# Patient Record
Sex: Female | Born: 1990 | Race: Asian | Hispanic: No | Marital: Married | Smoking: Never smoker
Health system: Southern US, Community
[De-identification: ages and names within clinical notes are randomized; demographics above are authoritative.]

## PROBLEM LIST (undated history)

## (undated) ENCOUNTER — Inpatient Hospital Stay (HOSPITAL_COMMUNITY): Payer: Self-pay

## (undated) DIAGNOSIS — I959 Hypotension, unspecified: Secondary | ICD-10-CM

## (undated) DIAGNOSIS — F329 Major depressive disorder, single episode, unspecified: Secondary | ICD-10-CM

## (undated) DIAGNOSIS — F32A Depression, unspecified: Secondary | ICD-10-CM

## (undated) DIAGNOSIS — R51 Headache: Secondary | ICD-10-CM

## (undated) DIAGNOSIS — R519 Headache, unspecified: Secondary | ICD-10-CM

## (undated) HISTORY — PX: TONSILLECTOMY: SUR1361

## (undated) HISTORY — DX: Depression, unspecified: F32.A

## (undated) HISTORY — DX: Major depressive disorder, single episode, unspecified: F32.9

---

## 2012-07-29 ENCOUNTER — Encounter: Payer: Self-pay | Admitting: Emergency Medicine

## 2012-07-29 ENCOUNTER — Ambulatory Visit: Payer: Self-pay | Admitting: Emergency Medicine

## 2012-07-29 VITALS — BP 95/61 | HR 79 | Temp 97.6°F | Resp 16 | Ht <= 58 in | Wt 80.4 lb

## 2012-07-29 DIAGNOSIS — F502 Bulimia nervosa, unspecified: Secondary | ICD-10-CM

## 2012-07-29 DIAGNOSIS — N926 Irregular menstruation, unspecified: Secondary | ICD-10-CM

## 2012-07-29 DIAGNOSIS — R112 Nausea with vomiting, unspecified: Secondary | ICD-10-CM

## 2012-07-29 DIAGNOSIS — N912 Amenorrhea, unspecified: Secondary | ICD-10-CM

## 2012-07-29 LAB — POCT URINE PREGNANCY: Preg Test, Ur: NEGATIVE

## 2012-07-29 NOTE — Progress Notes (Signed)
[facility] [address] [place] [address] [phone]  Date:  07/29/2012   Name:  Amanda Fischer   DOB:  1990/07/11   MRN:  [id number]  PCP:  No PCP Per Patient    Chief Complaint: Nausea and Emesis   History of Present Illness:  Amanda Fischer is a 22 y.o. very pleasant female patient who presents with the following:  Missed two periods and requests a pregnancy test as she is nauseated in the mornings.  Apparently she eats little and often vomits following meals  whe was treated by a vietnamese doctor in [place] with a medication for anxiety but they do not know the name of the medication.  Denies other complaint or health concern today.   There are no active problems to display for this patient.   No past medical history on file.  No past surgical history on file.  History  Substance Use Topics  . Smoking status: Never Smoker   . Smokeless tobacco: Not on file  . Alcohol Use: No    No family history on file.  No Known Allergies  Medication list has been reviewed and updated.  No current outpatient prescriptions on file prior to visit.   No current facility-administered medications on file prior to visit.    Review of Systems:  As per HPI, otherwise negative.    Physical Examination: Filed Vitals:   07/29/12 1101  BP: 95/61  Pulse: 79  Temp: 97.6 F (36.4 C)  Resp: 16   Filed Vitals:   07/29/12 1101  Height: 4' 9.5" (1.461 m)  Weight: 80 lb 6.4 oz (36.469 kg)   Body mass index is 17.09 kg/(m^2). Ideal Body Weight: Weight in (lb) to have BMI = 25: 117.3   GEN: very thin, NAD, Non-toxic, Alert & Oriented x 3 HEENT: Atraumatic, Normocephalic.  Ears and Nose: No external deformity. EXTR: No clubbing/cyanosis/edema NEURO: Normal gait.  PSYCH: Normally interactive. Conversant. Not depressed or anxious appearing.  Calm demeanor.    Assessment and Plan: Amenorrhea Underweight Bulimia Follow up with native language speaking  physician and therapist Signed,  Phillips Odor, MD

## 2013-02-19 NOTE — L&D Delivery Note (Signed)
Patient was C/C/+3 and pushed for 20 minutes withOUT epidural.   NSVD  female infant, Apgars 9,9, weight p.   The patient had a partial third degree laceration repaired with 2-0 vicryl R. Fundus was firm. EBL was expected. Placenta was delivered intact. Vagina was clear.  Baby was vigorous and doing skin to skin with mother.  Anniece Bleiler A

## 2013-06-15 ENCOUNTER — Inpatient Hospital Stay (HOSPITAL_COMMUNITY): Payer: Medicaid Other

## 2013-06-15 ENCOUNTER — Encounter (HOSPITAL_COMMUNITY): Payer: Self-pay

## 2013-06-15 ENCOUNTER — Inpatient Hospital Stay (HOSPITAL_COMMUNITY)
Admission: AD | Admit: 2013-06-15 | Discharge: 2013-06-15 | Disposition: A | Discharge status: Home / Self Care | Payer: Medicaid Other | Source: Ambulatory Visit | Attending: Obstetrics & Gynecology | Admitting: Obstetrics & Gynecology

## 2013-06-15 DIAGNOSIS — O209 Hemorrhage in early pregnancy, unspecified: Secondary | ICD-10-CM

## 2013-06-15 DIAGNOSIS — I959 Hypotension, unspecified: Secondary | ICD-10-CM | POA: Insufficient documentation

## 2013-06-15 DIAGNOSIS — O26899 Other specified pregnancy related conditions, unspecified trimester: Secondary | ICD-10-CM

## 2013-06-15 DIAGNOSIS — O99891 Other specified diseases and conditions complicating pregnancy: Secondary | ICD-10-CM | POA: Insufficient documentation

## 2013-06-15 DIAGNOSIS — R109 Unspecified abdominal pain: Secondary | ICD-10-CM

## 2013-06-15 DIAGNOSIS — O9989 Other specified diseases and conditions complicating pregnancy, childbirth and the puerperium: Secondary | ICD-10-CM

## 2013-06-15 DIAGNOSIS — O210 Mild hyperemesis gravidarum: Secondary | ICD-10-CM | POA: Insufficient documentation

## 2013-06-15 LAB — CBC
HCT: 33.3 % — ABNORMAL LOW (ref 36.0–46.0)
Hemoglobin: 11.5 g/dL — ABNORMAL LOW (ref 12.0–15.0)
MCH: 27.5 pg (ref 26.0–34.0)
MCHC: 34.5 g/dL (ref 30.0–36.0)
MCV: 79.7 fL (ref 78.0–100.0)
PLATELETS: 257 10*3/uL (ref 150–400)
RBC: 4.18 MIL/uL (ref 3.87–5.11)
RDW: 13.4 % (ref 11.5–15.5)
WBC: 10.2 10*3/uL (ref 4.0–10.5)

## 2013-06-15 LAB — URINALYSIS, ROUTINE W REFLEX MICROSCOPIC
BILIRUBIN URINE: NEGATIVE
GLUCOSE, UA: NEGATIVE mg/dL
Hgb urine dipstick: NEGATIVE
KETONES UR: NEGATIVE mg/dL
Leukocytes, UA: NEGATIVE
Nitrite: NEGATIVE
PH: 6 (ref 5.0–8.0)
Protein, ur: NEGATIVE mg/dL
Specific Gravity, Urine: <1.005 — ABNORMAL LOW (ref 1.005–1.030)
Urobilinogen, UA: 0.2 mg/dL (ref 0.0–1.0)

## 2013-06-15 LAB — WET PREP, GENITAL
CLUE CELLS WET PREP: NONE SEEN
Trich, Wet Prep: NONE SEEN
Yeast Wet Prep HPF POC: NONE SEEN

## 2013-06-15 MED ORDER — DEXTROSE 5 % IN LACTATED RINGERS IV BOLUS: 1000.0000 mL | Freq: Once | INTRAVENOUS | Status: DC

## 2013-06-15 MED ORDER — ONDANSETRON HCL 4 MG/2ML IJ SOLN
4.0000 mg | Freq: Once | INTRAMUSCULAR | Status: AC
  Administered 2013-06-15: 4 mg via INTRAVENOUS
  Filled 2013-06-15: qty 2

## 2013-06-15 MED ORDER — LACTATED RINGERS IV BOLUS (SEPSIS)
1000.0000 mL | Freq: Once | INTRAVENOUS | Status: AC
  Administered 2013-06-15: 1000 mL via INTRAVENOUS

## 2013-06-15 MED ORDER — PROMETHAZINE HCL 25 MG PO TABS: 12.5000 mg | ORAL_TABLET | Freq: Four times a day (QID) | ORAL | Status: DC | PRN

## 2013-06-15 NOTE — MAU Provider Note (Signed)
History     CSN: [id number]  Arrival date and time: 06/15/13 1318   None     Chief Complaint  Patient presents with  . Abdominal Pain  . Emesis   HPI  [name] is a 23 y.o.female G1P0 at 3312w3d who presents with abdominal pain that started yesterday. The pain is located in her RLQ- mid lower quadrant. She denies fever or chills. She is also experiencing nausea and vomiting that she has had throughout her pregnancy. She describes the pain as "numbing pain". She currently rates her pain 5/10.  Pt thinks she ate some food that is not sitting well on her stomach. She [facility]. Pts husband states she vomits everyday and this has been occuring throughout her pregnancy.   OB History   Grav Para Term Preterm Abortions TAB SAB Ect Mult Living   1               History reviewed. No pertinent past medical history.  History reviewed. No pertinent past surgical history.  History reviewed. No pertinent family history.  History  Substance Use Topics  . Smoking status: Never Smoker   . Smokeless tobacco: Not on file  . Alcohol Use: No    Allergies: No Known Allergies  No prescriptions prior to admission   Results for orders placed during the hospital encounter of 06/15/13 (from the past 48 hour(s))  URINALYSIS, ROUTINE W REFLEX MICROSCOPIC     Status: Abnormal   Collection Time    06/15/13  1:43 PM      Result Value Ref Range   Color, Urine YELLOW  YELLOW   APPearance CLEAR  CLEAR   Specific Gravity, Urine <1.005 (*) 1.005 - 1.030   pH 6.0  5.0 - 8.0   Glucose, UA NEGATIVE  NEGATIVE mg/dL   Hgb urine dipstick NEGATIVE  NEGATIVE   Bilirubin Urine NEGATIVE  NEGATIVE   Ketones, ur NEGATIVE  NEGATIVE mg/dL   Protein, ur NEGATIVE  NEGATIVE mg/dL   Urobilinogen, UA 0.2  0.0 - 1.0 mg/dL   Nitrite NEGATIVE  NEGATIVE   Leukocytes, UA NEGATIVE  NEGATIVE   Comment: MICROSCOPIC NOT DONE ON URINES WITH NEGATIVE PROTEIN, BLOOD, LEUKOCYTES,  NITRITE, OR GLUCOSE <1000 mg/dL.  WET PREP, GENITAL     Status: Abnormal   Collection Time    06/15/13  3:45 PM      Result Value Ref Range   Yeast Wet Prep HPF POC NONE SEEN  NONE SEEN   Trich, Wet Prep NONE SEEN  NONE SEEN   Clue Cells Wet Prep HPF POC NONE SEEN  NONE SEEN   WBC, Wet Prep HPF POC MANY (*) NONE SEEN   Comment: MANY BACTERIA SEEN  CBC     Status: Abnormal   Collection Time    06/15/13  3:53 PM      Result Value Ref Range   WBC 10.2  4.0 - 10.5 K/uL   RBC 4.18  3.87 - 5.11 MIL/uL   Hemoglobin 11.5 (*) 12.0 - 15.0 g/dL   HCT 40.933.3 (*) 81.136.0 - 91.446.0 %   MCV 79.7  78.0 - 100.0 fL   MCH 27.5  26.0 - 34.0 pg   MCHC 34.5  30.0 - 36.0 g/dL   RDW 78.213.4  95.611.5 - 21.315.5 %   Platelets 257  150 - 400 K/uL    Review of Systems  Constitutional: Negative for fever, chills, malaise/fatigue and diaphoresis.  Gastrointestinal: Positive  for nausea, vomiting and abdominal pain (Right lower quadrant pain ). Negative for diarrhea and constipation.  Genitourinary: Negative for dysuria, urgency, frequency and hematuria.       No vaginal discharge. No vaginal bleeding. No dysuria.   Neurological: Negative for dizziness and weakness.   Physical Exam   Blood pressure 92/61, pulse 70, temperature 98 F (36.7 C), temperature source Oral, resp. rate 16, height 4\' 9"  (1.448 m), weight 38.284 kg (84 lb 6.4 oz), last menstrual period 03/13/2013, SpO2 99.00%.  Physical Exam  Constitutional: She is oriented to person, place, and time. She appears well-developed and well-nourished. No distress.  HENT:  Head: Normocephalic.  Eyes: Pupils are equal, round, and reactive to light.  Neck: Neck supple.  Cardiovascular: Normal rate and normal heart sounds.   Respiratory: Effort normal and breath sounds normal. No respiratory distress.  GI: Soft. There is tenderness in the right lower quadrant and suprapubic area. There is rigidity and guarding. There is no rebound, no CVA tenderness and no tenderness  at McBurney's point.  Genitourinary:  Speculum exam: Vagina - Small amount of creamy discharge, no odor Cervix - + contact bleeding, no active bleeding.  Bimanual exam: Cervix closed, no CMT Uterus non tender, enlarged  Right adnexal tenderness, no masses bilaterally GC/Chlam, wet prep done Chaperone present for exam.   Musculoskeletal: Normal range of motion.  Neurological: She is alert and oriented to person, place, and time.  Skin: Skin is warm. She is not diaphoretic. There is pallor.  Psychiatric: Her behavior is normal.    MAU Course  Procedures None  MDM + fht 142 via fetal doppler.  Vietnamese interpretor used.   I asked the patient to jump up and down to elicit pain in the abdomen. Pt denies pain in the abdomen during test.  Negative fever, negative chills.  At discharge patient had a low blood pressure reading. Pt given 1 liter of fluid and zofran 4 mg IVP.  Dr. Mora ApplPinn notified of IV fluid and zofran administration; patient stable for discharge home.     Assessment and Plan   A:  1. Abdominal pain in pregnancy   2. Hypotension   3. Vaginal bleeding in pregnant patient at less than [redacted] weeks gestation   4.  Nausea and vomiting in pregnancy   P:  Discharge home in stable condition RX: phenergan  Pelvic rest  Keep your appointment next week. Return to MAU as needed, if symptoms worsen   Amanda HansenJennifer Irene Rasch, NP  06/15/2013, 3:21 PM

## 2013-06-15 NOTE — MAU Provider Note (Signed)
Reviewed case with NP and I agree with above note  Essie HartWalda Leanne Fischer

## 2013-06-15 NOTE — Discharge Instructions (Signed)
Abdominal Pain During Pregnancy °Abdominal pain is common in pregnancy. Most of the time, it does not cause harm. There are many causes of abdominal pain. Some causes are more serious than others. Some of the causes of abdominal pain in pregnancy are easily diagnosed. Occasionally, the diagnosis takes time to understand. Other times, the cause is not determined. Abdominal pain can be a sign that something is very wrong with the pregnancy, or the pain may have nothing to do with the pregnancy at all. For this reason, always tell your health care provider if you have any abdominal discomfort. °HOME CARE INSTRUCTIONS  °Monitor your abdominal pain for any changes. The following actions may help to alleviate any discomfort you are experiencing: °· Do not have sexual intercourse or put anything in your vagina until your symptoms go away completely. °· Get plenty of rest until your pain improves. °· Drink clear fluids if you feel nauseous. Avoid solid food as long as you are uncomfortable or nauseous. °· Only take over-the-counter or prescription medicine as directed by your health care provider. °· Keep all follow-up appointments with your health care provider. °SEEK IMMEDIATE MEDICAL CARE IF: °· You are bleeding, leaking fluid, or passing tissue from the vagina. °· You have increasing pain or cramping. °· You have persistent vomiting. °· You have painful or bloody urination. °· You have a fever. °· You notice a decrease in your baby's movements. °· You have extreme weakness or feel faint. °· You have shortness of breath, with or without abdominal pain. °· You develop a severe headache with abdominal pain. °· You have abnormal vaginal discharge with abdominal pain. °· You have persistent diarrhea. °· You have abdominal pain that continues even after rest, or gets worse. °MAKE SURE YOU:  °· Understand these instructions. °· Will watch your condition. °· Will get help right away if you are not doing well or get  worse. °Document Released: 02/05/2005 Document Revised: 11/26/2012 Document Reviewed: 09/04/2012 °ExitCare® Patient Information ©2014 [employer]. ° °

## 2013-06-15 NOTE — MAU Note (Signed)
Patient states she had a positive pregnancy test in Amanda Fischer that gives her a due date of 10-30 Has been having abdominal pain since yesterday. States she has had vomiting almost every day for a while. Denies bleeding or discharge. Has an appointment next week with Dr. Vincente PoliGrewal.

## 2013-06-16 LAB — GC/CHLAMYDIA PROBE AMP
CT PROBE, AMP APTIMA: NEGATIVE
GC PROBE AMP APTIMA: NEGATIVE

## 2013-06-22 ENCOUNTER — Other Ambulatory Visit: Payer: Self-pay | Admitting: Obstetrics and Gynecology

## 2013-06-22 LAB — OB RESULTS CONSOLE RUBELLA ANTIBODY, IGM: Rubella: IMMUNE

## 2013-06-22 LAB — OB RESULTS CONSOLE RPR: RPR: NONREACTIVE

## 2013-06-22 LAB — OB RESULTS CONSOLE HEPATITIS B SURFACE ANTIGEN: Hepatitis B Surface Ag: NEGATIVE

## 2013-06-22 LAB — OB RESULTS CONSOLE HIV ANTIBODY (ROUTINE TESTING): HIV: NONREACTIVE

## 2013-06-29 LAB — OB RESULTS CONSOLE ABO/RH: RH TYPE: POSITIVE

## 2013-06-29 LAB — OB RESULTS CONSOLE ANTIBODY SCREEN: Antibody Screen: NEGATIVE

## 2013-11-06 LAB — OB RESULTS CONSOLE GC/CHLAMYDIA
CHLAMYDIA, DNA PROBE: NEGATIVE
Gonorrhea: NEGATIVE

## 2013-11-06 LAB — OB RESULTS CONSOLE GBS: STREP GROUP B AG: NEGATIVE

## 2013-12-01 ENCOUNTER — Encounter (HOSPITAL_COMMUNITY): Payer: Self-pay

## 2013-12-01 ENCOUNTER — Inpatient Hospital Stay (HOSPITAL_COMMUNITY)
Admission: AD | Admit: 2013-12-01 | Discharge: 2013-12-04 | DRG: 989 | Disposition: A | Discharge status: Home / Self Care | Payer: BC Managed Care – PPO | Source: Ambulatory Visit | Attending: Obstetrics and Gynecology | Admitting: Obstetrics and Gynecology

## 2013-12-01 DIAGNOSIS — O9989 Other specified diseases and conditions complicating pregnancy, childbirth and the puerperium: Secondary | ICD-10-CM | POA: Diagnosis present

## 2013-12-01 DIAGNOSIS — IMO0001 Reserved for inherently not codable concepts without codable children: Secondary | ICD-10-CM

## 2013-12-01 DIAGNOSIS — Z3A39 39 weeks gestation of pregnancy: Secondary | ICD-10-CM | POA: Diagnosis present

## 2013-12-01 HISTORY — DX: Headache, unspecified: R51.9

## 2013-12-01 HISTORY — DX: Hypotension, unspecified: I95.9

## 2013-12-01 HISTORY — DX: Headache: R51

## 2013-12-01 LAB — ABO/RH: ABO/RH(D): O POS

## 2013-12-01 LAB — CBC
HEMATOCRIT: 36.5 % (ref 36.0–46.0)
HEMOGLOBIN: 12.3 g/dL (ref 12.0–15.0)
MCH: 26.6 pg (ref 26.0–34.0)
MCHC: 33.7 g/dL (ref 30.0–36.0)
MCV: 79 fL (ref 78.0–100.0)
Platelets: 261 10*3/uL (ref 150–400)
RBC: 4.62 MIL/uL (ref 3.87–5.11)
RDW: 15.2 % (ref 11.5–15.5)
WBC: 8.7 10*3/uL (ref 4.0–10.5)

## 2013-12-01 LAB — POCT FERN TEST: POCT FERN TEST: POSITIVE

## 2013-12-01 LAB — TYPE AND SCREEN
ABO/RH(D): O POS
Antibody Screen: NEGATIVE

## 2013-12-01 LAB — RPR

## 2013-12-01 MED ORDER — OXYTOCIN BOLUS FROM INFUSION
500.0000 mL | INTRAVENOUS | Status: DC
  Administered 2013-12-02: 500 mL via INTRAVENOUS

## 2013-12-01 MED ORDER — LACTATED RINGERS IV SOLN
INTRAVENOUS | Status: DC
  Administered 2013-12-01: 13:00:00 via INTRAVENOUS

## 2013-12-01 MED ORDER — LIDOCAINE HCL (PF) 1 % IJ SOLN
30.0000 mL | INTRAMUSCULAR | Status: AC | PRN
  Administered 2013-12-02: 30 mL via SUBCUTANEOUS
  Filled 2013-12-01: qty 30

## 2013-12-01 MED ORDER — FENTANYL 2.5 MCG/ML BUPIVACAINE 1/10 % EPIDURAL INFUSION (WH - ANES): 14.0000 mL/h | INTRAMUSCULAR | Status: DC | PRN

## 2013-12-01 MED ORDER — OXYCODONE-ACETAMINOPHEN 5-325 MG PO TABS
2.0000 | ORAL_TABLET | ORAL | Status: DC | PRN
Start: 2013-12-01 — End: 2013-12-02

## 2013-12-01 MED ORDER — PHENYLEPHRINE 40 MCG/ML (10ML) SYRINGE FOR IV PUSH (FOR BLOOD PRESSURE SUPPORT)
80.0000 ug | PREFILLED_SYRINGE | INTRAVENOUS | Status: DC | PRN
  Filled 2013-12-01: qty 2

## 2013-12-01 MED ORDER — LACTATED RINGERS IV SOLN
500.0000 mL | INTRAVENOUS | Status: DC | PRN
  Administered 2013-12-01: 500 mL via INTRAVENOUS

## 2013-12-01 MED ORDER — OXYTOCIN 40 UNITS IN LACTATED RINGERS INFUSION - SIMPLE MED
1.0000 m[IU]/min | INTRAVENOUS | Status: DC
  Administered 2013-12-01: 2 m[IU]/min via INTRAVENOUS
  Filled 2013-12-01: qty 1000

## 2013-12-01 MED ORDER — DIPHENHYDRAMINE HCL 50 MG/ML IJ SOLN: 12.5000 mg | INTRAMUSCULAR | Status: DC | PRN

## 2013-12-01 MED ORDER — OXYCODONE-ACETAMINOPHEN 5-325 MG PO TABS: 1.0000 | ORAL_TABLET | ORAL | Status: DC | PRN

## 2013-12-01 MED ORDER — FLEET ENEMA 7-19 GM/118ML RE ENEM: 1.0000 | ENEMA | RECTAL | Status: DC | PRN

## 2013-12-01 MED ORDER — EPHEDRINE 5 MG/ML INJ
10.0000 mg | INTRAVENOUS | Status: DC | PRN
  Filled 2013-12-01: qty 2

## 2013-12-01 MED ORDER — ONDANSETRON HCL 4 MG/2ML IJ SOLN: 4.0000 mg | Freq: Four times a day (QID) | INTRAMUSCULAR | Status: DC | PRN

## 2013-12-01 MED ORDER — ACETAMINOPHEN 325 MG PO TABS: 650.0000 mg | ORAL_TABLET | ORAL | Status: DC | PRN

## 2013-12-01 MED ORDER — BUTORPHANOL TARTRATE 1 MG/ML IJ SOLN
1.0000 mg | INTRAMUSCULAR | Status: DC | PRN
  Administered 2013-12-01: 1 mg via INTRAVENOUS
  Filled 2013-12-01: qty 1

## 2013-12-01 MED ORDER — TERBUTALINE SULFATE 1 MG/ML IJ SOLN: 0.2500 mg | Freq: Once | INTRAMUSCULAR | Status: AC | PRN

## 2013-12-01 MED ORDER — OXYTOCIN 40 UNITS IN LACTATED RINGERS INFUSION - SIMPLE MED: 62.5000 mL/h | INTRAVENOUS | Status: DC

## 2013-12-01 MED ORDER — CITRIC ACID-SODIUM CITRATE 334-500 MG/5ML PO SOLN: 30.0000 mL | ORAL | Status: DC | PRN

## 2013-12-01 MED ORDER — LACTATED RINGERS IV SOLN: 500.0000 mL | Freq: Once | INTRAVENOUS | Status: DC

## 2013-12-01 NOTE — Progress Notes (Signed)
[facility] called for admission Santina EvansCatherine #[id number]#[phone].

## 2013-12-01 NOTE — MAU Note (Signed)
Patient states she started leaking clear fluid at 0830. Denies pain or bleeding and reports good fetal movement.

## 2013-12-01 NOTE — H&P (Signed)
23 y.o. 392w1d  G1P0 comes in c/o SROM at 0830, clear fluid..  Otherwise has good fetal movement and no bleeding.  Past Medical History  Diagnosis Date  . Headache   . Hypotension     Past Surgical History  Procedure Laterality Date  . Tonsillectomy      OB History  Gravida Para Term Preterm AB SAB TAB Ectopic Multiple Living  1             # Outcome Date GA Lbr Len/2nd Weight Sex Delivery Anes PTL Lv  1 CUR               History   Social History  . Marital Status: Married    Spouse Name: N/A    Number of Children: N/A  . Years of Education: N/A   Occupational History  . Not on file.   Social History Main Topics  . Smoking status: Never Smoker   . Smokeless tobacco: Not on file  . Alcohol Use: No  . Drug Use: No  . Sexual Activity: Yes    Partners: Male     Comment: married   Other Topics Concern  . Not on file   Social History Narrative  . No narrative on file   Review of patient's allergies indicates no known allergies.    Prenatal Transfer Tool  Maternal Diabetes: No Genetic Screening: Normal Maternal Ultrasounds/Referrals: Normal Fetal Ultrasounds or other Referrals:  None Maternal Substance Abuse:  No Significant Maternal Medications:  None Significant Maternal Lab Results: None  Other PNC: uncomplicated.    Filed Vitals:   12/01/13 1548  BP: 89/55  Pulse: 100  Temp:   Resp: 20     Lungs/Cor:  NAD Abdomen:  soft, gravid Ex:  no cords, erythema SVE:  2.5/70/-2 per nurse. FHTs:  120, good STV, NST R Toco:  q3-4   A/P   Term SROM. Pitocin augmentation if needed.   GBS neg.  Fallon Haecker A

## 2013-12-02 ENCOUNTER — Encounter (HOSPITAL_COMMUNITY): Payer: Self-pay

## 2013-12-02 LAB — CBC
HEMATOCRIT: 32.8 % — AB (ref 36.0–46.0)
Hemoglobin: 11.1 g/dL — ABNORMAL LOW (ref 12.0–15.0)
MCH: 27 pg (ref 26.0–34.0)
MCHC: 33.8 g/dL (ref 30.0–36.0)
MCV: 79.8 fL (ref 78.0–100.0)
Platelets: 264 10*3/uL (ref 150–400)
RBC: 4.11 MIL/uL (ref 3.87–5.11)
RDW: 15.3 % (ref 11.5–15.5)
WBC: 19 10*3/uL — ABNORMAL HIGH (ref 4.0–10.5)

## 2013-12-02 MED ORDER — MEASLES, MUMPS & RUBELLA VAC ~~LOC~~ INJ
0.5000 mL | INJECTION | Freq: Once | SUBCUTANEOUS | Status: DC
  Filled 2013-12-02: qty 0.5

## 2013-12-02 MED ORDER — METHYLERGONOVINE MALEATE 0.2 MG/ML IJ SOLN: 0.2000 mg | INTRAMUSCULAR | Status: DC | PRN

## 2013-12-02 MED ORDER — ZOLPIDEM TARTRATE 5 MG PO TABS: 5.0000 mg | ORAL_TABLET | Freq: Every evening | ORAL | Status: DC | PRN

## 2013-12-02 MED ORDER — BENZOCAINE-MENTHOL 20-0.5 % EX AERO
1.0000 "application " | INHALATION_SPRAY | CUTANEOUS | Status: DC | PRN
  Administered 2013-12-02: 1 via TOPICAL
  Filled 2013-12-02: qty 56

## 2013-12-02 MED ORDER — SODIUM CHLORIDE 0.9 % IV SOLN: 250.0000 mL | INTRAVENOUS | Status: DC | PRN

## 2013-12-02 MED ORDER — METHYLERGONOVINE MALEATE 0.2 MG PO TABS: 0.2000 mg | ORAL_TABLET | ORAL | Status: DC | PRN

## 2013-12-02 MED ORDER — WITCH HAZEL-GLYCERIN EX PADS: 1.0000 "application " | MEDICATED_PAD | CUTANEOUS | Status: DC | PRN

## 2013-12-02 MED ORDER — SODIUM CHLORIDE 0.9 % IJ SOLN: 3.0000 mL | Freq: Two times a day (BID) | INTRAMUSCULAR | Status: DC

## 2013-12-02 MED ORDER — SENNOSIDES-DOCUSATE SODIUM 8.6-50 MG PO TABS
2.0000 | ORAL_TABLET | ORAL | Status: DC
  Administered 2013-12-02: 2 via ORAL
  Filled 2013-12-02: qty 2

## 2013-12-02 MED ORDER — FERROUS SULFATE 325 (65 FE) MG PO TABS
325.0000 mg | ORAL_TABLET | Freq: Two times a day (BID) | ORAL | Status: DC
  Administered 2013-12-02 – 2013-12-04 (×5): 325 mg via ORAL
  Filled 2013-12-02 (×5): qty 1

## 2013-12-02 MED ORDER — DIBUCAINE 1 % RE OINT: 1.0000 "application " | TOPICAL_OINTMENT | RECTAL | Status: DC | PRN

## 2013-12-02 MED ORDER — IBUPROFEN 800 MG PO TABS
800.0000 mg | ORAL_TABLET | Freq: Three times a day (TID) | ORAL | Status: DC
Start: 2013-12-02 — End: 2013-12-04
  Administered 2013-12-02 – 2013-12-04 (×7): 800 mg via ORAL
  Filled 2013-12-02 (×7): qty 1

## 2013-12-02 MED ORDER — ONDANSETRON HCL 4 MG/2ML IJ SOLN: 4.0000 mg | INTRAMUSCULAR | Status: DC | PRN

## 2013-12-02 MED ORDER — INFLUENZA VAC SPLIT QUAD 0.5 ML IM SUSY
0.5000 mL | PREFILLED_SYRINGE | INTRAMUSCULAR | Status: AC
  Administered 2013-12-03: 0.5 mL via INTRAMUSCULAR
  Filled 2013-12-02: qty 0.5

## 2013-12-02 MED ORDER — PRENATAL MULTIVITAMIN CH
1.0000 | ORAL_TABLET | Freq: Every day | ORAL | Status: DC
  Administered 2013-12-02 – 2013-12-04 (×3): 1 via ORAL
  Filled 2013-12-02 (×3): qty 1

## 2013-12-02 MED ORDER — OXYCODONE-ACETAMINOPHEN 5-325 MG PO TABS: 2.0000 | ORAL_TABLET | ORAL | Status: DC | PRN

## 2013-12-02 MED ORDER — SODIUM CHLORIDE 0.9 % IJ SOLN: 3.0000 mL | INTRAMUSCULAR | Status: DC | PRN

## 2013-12-02 MED ORDER — TETANUS-DIPHTH-ACELL PERTUSSIS 5-2.5-18.5 LF-MCG/0.5 IM SUSP
0.5000 mL | Freq: Once | INTRAMUSCULAR | Status: AC
Start: 2013-12-03 — End: 2013-12-02
  Administered 2013-12-02: 0.5 mL via INTRAMUSCULAR

## 2013-12-02 MED ORDER — LANOLIN HYDROUS EX OINT: TOPICAL_OINTMENT | CUTANEOUS | Status: DC | PRN

## 2013-12-02 MED ORDER — SIMETHICONE 80 MG PO CHEW: 80.0000 mg | CHEWABLE_TABLET | ORAL | Status: DC | PRN

## 2013-12-02 MED ORDER — OXYCODONE-ACETAMINOPHEN 5-325 MG PO TABS: 1.0000 | ORAL_TABLET | ORAL | Status: DC | PRN

## 2013-12-02 MED ORDER — ONDANSETRON HCL 4 MG PO TABS: 4.0000 mg | ORAL_TABLET | ORAL | Status: DC | PRN

## 2013-12-02 MED ORDER — MAGNESIUM HYDROXIDE 400 MG/5ML PO SUSP: 30.0000 mL | ORAL | Status: DC | PRN

## 2013-12-02 MED ORDER — DIPHENHYDRAMINE HCL 25 MG PO CAPS: 25.0000 mg | ORAL_CAPSULE | Freq: Four times a day (QID) | ORAL | Status: DC | PRN

## 2013-12-02 NOTE — Lactation Note (Signed)
This note was copied from the chart of Amanda Fischer. Lactation Consultation Note  Patient Name: Amanda Fischer Today's Date: 12/02/2013 Reason for consult: Initial assessment of this primipara and her newborn at 3121 hours of age.  Mom is primipara and speaks only [place]namese but FOB is at bedside and speaks and understands English and is able to translate for his wife.  Mom's choice on admission was to both breastfeed and formula feed and mom has been giving formula, stating that she "has no milk" per FOB.  LC reviewed small newborn stomach size and benefits of breast milk, as well as need for frequent STS and cue feedings and avoiding supplement while establishing milk supply.  FOB verbalizes understanding this recommendation but insists that baby still needs some formula. Baby has been having LATCH scores of 9 per RN assessment but has also received 10-20 ml's of formula at three feedings this evening.  Mom encouraged to feed baby 8-12 times/24 hours and with feeding cues. LC encouraged review of Baby and Me pp 9, 14 and 20-25 for STS and BF information. LC provided [facility] Resource brochure and reviewed [facility] services and list of community and web site resources.    Maternal Data Formula Feeding for Exclusion: Yes Reason for exclusion: Mother's choice to formula and breast feed on admission Has patient been taught Hand Expression?: Yes (documented on education of mother, per RN) Does the patient have breastfeeding experience prior to this delivery?: No  Feeding    LATCH Score/Interventions           LATCH scores=9 at two feedings per RN assessment           Lactation Tools Discussed/Used   STS, cue feedings, hand expression and reasons for mom to have small amounts of colostrum in her soft breasts while baby is learning to breastfeed, reasons to avoid formula while establishing breastfeeding and milk supply  Consult Status Consult Status: Follow-up Date: 12/03/13 Follow-up type:  In-patient    Warrick ParisianBryant, Yentl Verge [facility] 12/02/2013, 10:24 PM

## 2013-12-02 NOTE — Progress Notes (Signed)
Patient and baby care education reviewed with patient using [employer] [phone]#[id number]. Patient verbalized her understanding. FOB at bedside. Boykin PeekNancy Yuliza Cara, RN

## 2013-12-03 NOTE — Lactation Note (Signed)
This note was copied from the chart of Girl Kamyah Dake. Lactation Consultation Note  Patient Name: Girl Sharhonda Mederos ZOXWR'UToday's Date: 12/03/2013 Reason Meyer Russelfor consult: Follow-up assessment Family member present to translate. Mom [facility]. Baby demonstrating a good rhythmic suck with few swallows noted. Mom reports some mild nipple tenderness, no breakdown noted. Care for sore nipples reviewed with Mom, advised apply EBM. Mom is breast/formula feeding. Reviewed importance of BF with each feeding before offering any bottles. Reviewed supplemental guidelines per hours of age. Mom denies other questions/concerns. Encouraged to ask for assist as needed.   Maternal Data    Feeding Feeding Type: Breast Fed Nipple Type: Slow - flow Length of feed: 20 min  LATCH Score/Interventions Latch: Grasps breast easily, tongue down, lips flanged, rhythmical sucking.  Audible Swallowing: A few with stimulation Intervention(s): Skin to skin;Hand expression  Type of Nipple: Everted at rest and after stimulation  Comfort (Breast/Nipple): Filling, red/small blisters or bruises, mild/mod discomfort  Problem noted: Mild/Moderate discomfort (No breakdown noted, advised apply EBM) Interventions (Mild/moderate discomfort): Hand expression  Hold (Positioning): No assistance needed to correctly position infant at breast.  LATCH Score: 8  Lactation Tools Discussed/Used     Consult Status Consult Status: Follow-up Date: 12/04/13 Follow-up type: In-patient    Alfred LevinsGranger, Seanmichael Salmons Ann 12/03/2013, 3:11 PM

## 2013-12-03 NOTE — Progress Notes (Signed)
Post Partum Day 1 Subjective: no complaints, up ad lib, voiding, tolerating PO and breast feeding  Objective: Blood pressure 81/50, pulse 81, temperature 97.8 F (36.6 C), temperature source Oral, resp. rate 17, height 4' 9.25" (1.454 m), weight 53.434 kg (117 lb 12.8 oz), last menstrual period 03/13/2013, SpO2 99.00%, unknown if currently breastfeeding.  Physical Exam:  General: alert, cooperative and no distress Lochia: appropriate Uterine Fundus: firm DVT Evaluation: No evidence of DVT seen on physical exam. Negative Homan's sign. No cords or calf tenderness.   Recent Labs  12/01/13 1232 12/02/13 0655  HGB 12.3 11.1*  HCT 36.5 32.8*    Assessment/Plan: Plan for discharge tomorrow and Breastfeeding   LOS: 2 days   Theordore Cisnero STACIA 12/03/2013, 8:58 AM

## 2013-12-04 NOTE — Discharge Summary (Signed)
Obstetric Discharge Summary Reason for Admission: rupture of membranes Prenatal Procedures: ultrasound Intrapartum Procedures: spontaneous vaginal delivery Postpartum Procedures: none Complications-Operative and Postpartum: 3rd degree perineal laceration Hemoglobin  Date Value Ref Range Status  12/02/2013 11.1* 12.0 - 15.0 g/dL Final     HCT  Date Value Ref Range Status  12/02/2013 32.8* 36.0 - 46.0 % Final    Physical Exam:  General: alert and cooperative Lochia: appropriate Uterine Fundus: firm DVT Evaluation: No evidence of DVT seen on physical exam.  Discharge Diagnoses: Term Pregnancy-delivered  Discharge Information: Date: 12/04/2013 Activity: pelvic rest Diet: routine Medications: PNV and Ibuprofen Condition: stable Instructions: refer to practice specific booklet Discharge to: home Follow-up Information   Follow up with HORVATH,MICHELLE A, MD In 4 weeks.   Specialty:  Obstetrics and Gynecology   Contact information:   [address] [place] [id number] [phone]       Newborn Data: Live born female  Birth Weight: 6 lb 10.5 oz (3020 g) APGAR: 9, 9  Home with mother.  Amanda Fischer 12/04/2013, 10:05 AM

## 2013-12-04 NOTE — Lactation Note (Signed)
This note was copied from the chart of Amanda Marisal Brouillet. Lactation Consultation Note  Patient Name: Amanda Fischer [id number]'s Date: 12/04/2013 Reason for consult: Follow-up assessment  Mom c/o pinching pain with breastfeeding upon entering room.  Infant at breast with a shallow latch using cradle hold.  LC assisted with depth and flanging of lips.  Dad interpreted for mom. Infant has breastfed x7 (10-20 min) + bottles formula x5 (12-25 ml) in past 24 hrs; voids - 3 in 24 hrs/ 5 life; stools-3 in 24 hrs/ 4 life.  Taught mom cross-cradle hold and how to achieve depth with flanging lips.  After assisting with depth, mom stated the feeding felt "normal."  Mom's breasts are filling; educated on milk production, supply/demand, importance of exclusive breastfeeding r/t milk supply, and engorgement prevention.  Mom has hand pump for discharge.  Used Baby & Me booklet for teaching about milk storage and pictures of feeding positions.  Mom has WIC.  Informed of support group and outpatient services.  Encouraged to call for questions after discharge as needed.     Maternal Data    Feeding Feeding Type: Breast Fed Length of feed: 20 min  LATCH Score/Interventions Latch: Grasps breast easily, tongue down, lips flanged, rhythmical sucking.  Audible Swallowing: A few with stimulation  Type of Nipple: Everted at rest and after stimulation  Comfort (Breast/Nipple): Soft / non-tender     Hold (Positioning): Assistance needed to correctly position infant at breast and maintain latch. Intervention(s): Breastfeeding basics reviewed;Support Pillows;Position options;Skin to skin  LATCH Score: 8  Lactation Tools Discussed/Used WIC Program: Yes   Consult Status Consult Status: Complete    Lendon KaVann, October Peery Walker 12/04/2013, 11:03 AM

## 2013-12-04 NOTE — Progress Notes (Signed)
Patient is eating, ambulating, voiding.  Pain control is good.  Appropriate lochia.  No complaints.  Filed Vitals:   12/03/13 0700 12/03/13 0830 12/03/13 1818 12/04/13 0610  BP: 81/50 85/56 84/53  84/56  Pulse: 81 85 95 79  Temp:   97.9 F (36.6 C) 97.8 F (36.6 C)  TempSrc:   Oral Oral  Resp:   18 18  Height:      Weight:      SpO2:    100%    Fundus firm Perineum without swelling. No CT  Lab Results  Component Value Date   WBC 19.0* 12/02/2013   HGB 11.1* 12/02/2013   HCT 32.8* 12/02/2013   MCV 79.8 12/02/2013   PLT 264 12/02/2013    --/--/O POS, O POS (10/13 1415)  A/P Post partum day 2.  Routine care.  D/C today.    Philip AspenALLAHAN, Sui Kasparek

## 2013-12-07 NOTE — Progress Notes (Signed)
Post discharge chart review completed.  

## 2013-12-21 ENCOUNTER — Encounter (HOSPITAL_COMMUNITY): Payer: Self-pay

## 2015-02-16 ENCOUNTER — Ambulatory Visit (INDEPENDENT_AMBULATORY_CARE_PROVIDER_SITE_OTHER): Payer: 59

## 2015-02-16 ENCOUNTER — Ambulatory Visit (INDEPENDENT_AMBULATORY_CARE_PROVIDER_SITE_OTHER): Payer: 59 | Admitting: Family Medicine

## 2015-02-16 VITALS — BP 80/60 | HR 78 | Temp 97.9°F | Resp 16 | Ht <= 58 in | Wt 91.0 lb

## 2015-02-16 DIAGNOSIS — R1033 Periumbilical pain: Secondary | ICD-10-CM

## 2015-02-16 DIAGNOSIS — R51 Headache: Secondary | ICD-10-CM | POA: Diagnosis not present

## 2015-02-16 DIAGNOSIS — R1011 Right upper quadrant pain: Secondary | ICD-10-CM

## 2015-02-16 DIAGNOSIS — R110 Nausea: Secondary | ICD-10-CM | POA: Diagnosis not present

## 2015-02-16 DIAGNOSIS — R519 Headache, unspecified: Secondary | ICD-10-CM

## 2015-02-16 LAB — POCT CBC
Granulocyte percent: 53 %G (ref 37–80)
HEMATOCRIT: 39.3 % (ref 37.7–47.9)
Hemoglobin: 13.3 g/dL (ref 12.2–16.2)
LYMPH, POC: 1.7 (ref 0.6–3.4)
MCH, POC: 27 pg (ref 27–31.2)
MCHC: 33.8 g/dL (ref 31.8–35.4)
MCV: 80 fL (ref 80–97)
MID (cbc): 0.6 (ref 0–0.9)
MPV: 8.2 fL (ref 0–99.8)
PLATELET COUNT, POC: 252 10*3/uL (ref 142–424)
POC Granulocyte: 2.6 (ref 2–6.9)
POC LYMPH %: 33.9 % (ref 10–50)
POC MID %: 13.1 %M — AB (ref 0–12)
RBC: 4.91 M/uL (ref 4.04–5.48)
RDW, POC: 13.2 %
WBC: 4.9 10*3/uL (ref 4.6–10.2)

## 2015-02-16 LAB — COMPLETE METABOLIC PANEL WITH GFR
ALBUMIN: 4.5 g/dL (ref 3.6–5.1)
ALK PHOS: 54 U/L (ref 33–115)
ALT: 15 U/L (ref 6–29)
AST: 19 U/L (ref 10–30)
BUN: 10 mg/dL (ref 7–25)
CO2: 24 mmol/L (ref 20–31)
Calcium: 9.2 mg/dL (ref 8.6–10.2)
Chloride: 106 mmol/L (ref 98–110)
Creat: 0.64 mg/dL (ref 0.50–1.10)
GFR, Est African American: >89 mL/min (ref >=60)
GFR, Est Non African American: >89 mL/min (ref >=60)
Glucose, Bld: 75 mg/dL (ref 65–99)
POTASSIUM: 4.5 mmol/L (ref 3.5–5.3)
SODIUM: 141 mmol/L (ref 135–146)
TOTAL PROTEIN: 7.5 g/dL (ref 6.1–8.1)
Total Bilirubin: 0.5 mg/dL (ref 0.2–1.2)

## 2015-02-16 LAB — POCT URINALYSIS DIP (MANUAL ENTRY)
BILIRUBIN UA: NEGATIVE
Bilirubin, UA: NEGATIVE
Blood, UA: NEGATIVE
Glucose, UA: NEGATIVE
NITRITE UA: NEGATIVE
PH UA: 6
PROTEIN UA: NEGATIVE
Spec Grav, UA: 1.01
Urobilinogen, UA: 0.2

## 2015-02-16 LAB — POC MICROSCOPIC URINALYSIS (UMFC): Mucus: ABSENT

## 2015-02-16 LAB — POCT URINE PREGNANCY: Preg Test, Ur: NEGATIVE

## 2015-02-16 MED ORDER — ONDANSETRON 4 MG PO TBDP: ORAL_TABLET | ORAL | Status: DC

## 2015-02-16 NOTE — Progress Notes (Signed)
Patient ID: Amanda Fischer, female    DOB: 1990-12-11  Age: 24 y.o. MRN: [id number]  Chief Complaint  Patient presents with  . Abdominal Pain    Onset 2 days  . Headache  . Anorexia    Onset 2 days    Subjective:   24 year old [place] American lady who has a 2 day history of abdominal pain. It is hurt her in the mid abdomen and right upper quadrant areas. She's been nauseated but not vomiting. She has not felt febrile. She does have a headache. Her last menstrual cycle was the first of the month, does not think she is pregnant. She is gravida 2 para 2. She has not had this problem in the past. She has not had any vomiting or diarrhea. Her bowels been hard.  Current allergies, medications, problem list, past/family and social histories reviewed.  Objective:  BP 80/60 mmHg  Pulse 78  Temp(Src) 97.9 F (36.6 C) (Oral)  Resp 16  Ht 4\' 9"  (1.448 m)  Wt 91 lb (41.277 kg)  BMI 19.69 kg/m2  SpO2 98%  LMP 01/20/2015  No major acute distress. She has not had an appetite. Her neck is supple without nodes. Chest clear. Heart regular without murmurs. Abdomen has active bowel sounds, soft without organomegaly or masses. Has diffuse tenderness, primarily in the mid abdomen and right upper quadrant. She feels a little firm in the sigmoid region. Rectal not done at this time.  Assessment & Plan:   Assessment: 1. Periumbilical abdominal pain   2. Right upper quadrant pain   3. Nausea without vomiting   4. Nonintractable headache, unspecified chronicity pattern, unspecified headache type       Plan: We'll get a pregnancy test, urinalysis, CBC, CMP, and x-ray of the abdomen   [facility] reading (PRIMARY) by  Dr. Alwyn RenHopper Nonspecific gas pattern.  Results for orders placed or performed in visit on 02/16/15  POCT Microscopic Urinalysis ([facility])  Result Value Ref Range   WBC,UR,HPF,POC None None WBC/hpf   RBC,UR,HPF,POC None None RBC/hpf   Bacteria Few (A) None, Too numerous to count   Mucus  Absent Absent   Epithelial Cells, UR Per Microscopy Few (A) None, Too numerous to count cells/hpf  POCT urinalysis dipstick  Result Value Ref Range   Color, UA yellow yellow   Clarity, UA clear clear   Glucose, UA negative negative   Bilirubin, UA negative negative   Ketones, POC UA negative negative   Spec Grav, UA 1.010    Blood, UA negative negative   pH, UA 6.0    Protein Ur, POC negative negative   Urobilinogen, UA 0.2    Nitrite, UA Negative Negative   Leukocytes, UA Trace (A) Negative  POCT urine pregnancy  Result Value Ref Range   Preg Test, Ur Negative Negative  POCT CBC  Result Value Ref Range   WBC 4.9 4.6 - 10.2 K/uL   Lymph, poc 1.7 0.6 - 3.4   POC LYMPH PERCENT 33.9 10 - 50 %L   MID (cbc) 0.6 0 - 0.9   POC MID % 13.1 (A) 0 - 12 %M   POC Granulocyte 2.6 2 - 6.9   Granulocyte percent 53.0 37 - 80 %G   RBC 4.91 4.04 - 5.48 M/uL   Hemoglobin 13.3 12.2 - 16.2 g/dL   HCT, POC 25.939.3 56.337.7 - 47.9 %   MCV 80.0 80 - 97 fL   MCH, POC 27.0 27 - 31.2 pg   MCHC 33.8 31.8 - 35.4 g/dL  RDW, POC 13.2 %   Platelet Count, POC 252 142 - 424 K/uL   MPV 8.2 0 - 99.8 fL   We'll try to have her take some MiraLAX and see if she will get feeling better. Return if worse..        Patient Instructions  Drink plenty of fluids  Take ondansetron (Zofran) one every 6 hours if needed for nausea or vomiting  Take some MiraLAX 1 dose daily to try and clean out the bowels. I think this will help relieve the pain. She has a lot of gas trapped in her intestine.  Return or go to the emergency room if worse at any time, especially if more pain, vomiting, passing of blood, fevers, or other things of concern.     Return if symptoms worsen or fail to improve.   HOPPER,DAVID, MD 02/16/2015

## 2015-02-16 NOTE — Patient Instructions (Signed)
Drink plenty of fluids  Take ondansetron (Zofran) one every 6 hours if needed for nausea or vomiting  Take some MiraLAX 1 dose daily to try and clean out the bowels. I think this will help relieve the pain. She has a lot of gas trapped in her intestine.  Return or go to the emergency room if worse at any time, especially if more pain, vomiting, passing of blood, fevers, or other things of concern.

## 2015-02-20 ENCOUNTER — Encounter: Payer: Self-pay | Admitting: *Deleted

## 2015-09-06 ENCOUNTER — Ambulatory Visit (INDEPENDENT_AMBULATORY_CARE_PROVIDER_SITE_OTHER): Payer: Self-pay | Admitting: Physician Assistant

## 2015-09-06 VITALS — BP 108/68 | HR 83 | Temp 98.1°F | Resp 17 | Ht <= 58 in | Wt 88.0 lb

## 2015-09-06 DIAGNOSIS — N912 Amenorrhea, unspecified: Secondary | ICD-10-CM

## 2015-09-06 LAB — POCT URINE PREGNANCY: Preg Test, Ur: POSITIVE — AB

## 2015-09-06 NOTE — Progress Notes (Signed)
   09/06/2015 3:08 PM   DOB: Feb 27, 1990 / MRN: [id number]  SUBJECTIVE:  Amanda Fischer is a 25 y.o. female presenting for a pregnancy test.  LMP was 08/01/15.  She has been having morning sickness with emesis for the last month.  She is not taking prenatals vitamins. She denies any pain at this time.  She denies any bloody vaginal discharge. She and her partner have been trying to have a baby and have one at home already.   She has No Known Allergies.   She  has a past medical history of Headache and Hypotension.    She  reports that she has never smoked. She does not have any smokeless tobacco history on file. She reports that she does not drink alcohol or use illicit drugs. She  reports that she currently engages in sexual activity and has had female partners. The patient  has past surgical history that includes Tonsillectomy.  Her family history is not on file.  Review of Systems  Constitutional: Negative for fever and chills.  Gastrointestinal: Negative for nausea, vomiting and abdominal pain.    Problem list and medications reviewed and updated by myself where necessary, and exist elsewhere in the encounter.   OBJECTIVE:  BP 108/68 mmHg  Pulse 83  Temp(Src) 98.1 F (36.7 C) (Oral)  Resp 17  Ht 4\' 9"  (1.448 m)  Wt 88 lb (39.917 kg)  BMI 19.04 kg/m2  SpO2 100%  LMP 08/01/2015  Breastfeeding? No  Physical Exam  Cardiovascular: Normal rate.   Pulmonary/Chest: Effort normal.  Abdominal: She exhibits no distension.    Results for orders placed or performed in visit on 09/06/15 (from the past 72 hour(s))  POCT urine pregnancy     Status: Abnormal   Collection Time: 09/06/15  3:03 PM  Result Value Ref Range   Preg Test, Ur Positive (A) Negative    No results found.  Wt Readings from Last 3 Encounters:  09/06/15 88 lb (39.917 kg)  02/16/15 91 lb (41.277 kg)  12/01/13 117 lb 12.8 oz (53.434 kg)     ASSESSMENT AND PLAN  Amanda Fischer was seen today for pregnancy test.  Diagnoses  and all orders for this visit:  Amenorrhea: 2/2 pregnancy.  Due date provided based on LMP.  Advised [facility].  Prenatals.  Avoid any medication but tylenol.  -     POCT urine pregnancy    The patient was advised to call or return to clinic if she does not see an improvement in symptoms, or to seek the care of the closest emergency department if she worsens with the above plan.   Deliah BostonMichael Clark, MHS, PA-C [facility] 09/06/2015 3:08 PM

## 2015-09-06 NOTE — Patient Instructions (Addendum)
You due date is March 19th 2018.  Please start taking one prenatal vitamin daily. [facility] the [facility] and establish an obstetric's appointment.     IF you received an x-ray today, you will receive an invoice from [facility]. Please contact [facility] at [phone] with questions or concerns regarding your invoice.   IF you received labwork today, you will receive an invoice from [facility]. Please contact [facility] at [phone] with questions or concerns regarding your invoice.   Our billing staff will not be able to assist you with questions regarding bills from these companies.  You will be contacted with the lab results as soon as they are available. The fastest way to get your results is to activate your My Chart account. Instructions are located on the last page of this paperwork. If you have not heard from us regarding the results in 2 weeks, please contact this office.

## 2015-11-11 LAB — OB RESULTS CONSOLE HEPATITIS B SURFACE ANTIGEN: Hepatitis B Surface Ag: NEGATIVE

## 2015-11-11 LAB — OB RESULTS CONSOLE GC/CHLAMYDIA
CHLAMYDIA, DNA PROBE: NEGATIVE
GC PROBE AMP, GENITAL: NEGATIVE

## 2015-11-11 LAB — OB RESULTS CONSOLE RPR: RPR: NONREACTIVE

## 2015-11-11 LAB — OB RESULTS CONSOLE ANTIBODY SCREEN: ANTIBODY SCREEN: NEGATIVE

## 2015-11-11 LAB — OB RESULTS CONSOLE HIV ANTIBODY (ROUTINE TESTING): HIV: NONREACTIVE

## 2015-11-11 LAB — OB RESULTS CONSOLE ABO/RH: RH Type: POSITIVE

## 2015-11-11 LAB — OB RESULTS CONSOLE RUBELLA ANTIBODY, IGM: RUBELLA: IMMUNE

## 2016-02-20 NOTE — L&D Delivery Note (Signed)
Pt was admitted in labor. She had an amniotomy with light mec. She progressed along a nl labor curve. She pushed for 15 min. She had a SVD on one live infant over a second degree midline tear in the ROA position. Nuchal cord x 2. Placenta -S/I EBL-400cc. Tear closed with 3-0 chromic. Baby to NBN.

## 2016-03-28 LAB — OB RESULTS CONSOLE GBS: STREP GROUP B AG: NEGATIVE

## 2016-04-20 ENCOUNTER — Encounter (HOSPITAL_COMMUNITY): Payer: Self-pay | Admitting: *Deleted

## 2016-04-20 ENCOUNTER — Telehealth (HOSPITAL_COMMUNITY): Payer: Self-pay | Admitting: *Deleted

## 2016-04-20 NOTE — Telephone Encounter (Signed)
Preadmission screen  

## 2016-04-23 ENCOUNTER — Encounter (HOSPITAL_COMMUNITY): Payer: Self-pay

## 2016-04-23 ENCOUNTER — Inpatient Hospital Stay (HOSPITAL_COMMUNITY): Admission: RE | Admit: 2016-04-23 | Payer: Medicaid Other | Source: Ambulatory Visit

## 2016-04-23 ENCOUNTER — Inpatient Hospital Stay (HOSPITAL_COMMUNITY): Payer: Medicaid Other | Admitting: Anesthesiology

## 2016-04-23 ENCOUNTER — Inpatient Hospital Stay (HOSPITAL_COMMUNITY)
Admission: AD | Admit: 2016-04-23 | Discharge: 2016-04-25 | DRG: 775 | Disposition: A | Discharge status: Home / Self Care | Payer: Medicaid Other | Source: Ambulatory Visit | Attending: Obstetrics and Gynecology | Admitting: Obstetrics and Gynecology

## 2016-04-23 DIAGNOSIS — Z3490 Encounter for supervision of normal pregnancy, unspecified, unspecified trimester: Secondary | ICD-10-CM

## 2016-04-23 DIAGNOSIS — Z3493 Encounter for supervision of normal pregnancy, unspecified, third trimester: Secondary | ICD-10-CM | POA: Diagnosis present

## 2016-04-23 DIAGNOSIS — Z3A39 39 weeks gestation of pregnancy: Secondary | ICD-10-CM

## 2016-04-23 LAB — CBC
HEMATOCRIT: 34.5 % — AB (ref 36.0–46.0)
Hemoglobin: 11.5 g/dL — ABNORMAL LOW (ref 12.0–15.0)
MCH: 24.8 pg — ABNORMAL LOW (ref 26.0–34.0)
MCHC: 33.3 g/dL (ref 30.0–36.0)
MCV: 74.4 fL — ABNORMAL LOW (ref 78.0–100.0)
PLATELETS: 311 10*3/uL (ref 150–400)
RBC: 4.64 MIL/uL (ref 3.87–5.11)
RDW: 16.1 % — AB (ref 11.5–15.5)
WBC: 10.7 10*3/uL — ABNORMAL HIGH (ref 4.0–10.5)

## 2016-04-23 LAB — TYPE AND SCREEN
ABO/RH(D): O POS
Antibody Screen: NEGATIVE

## 2016-04-23 MED ORDER — OXYCODONE-ACETAMINOPHEN 5-325 MG PO TABS: 2.0000 | ORAL_TABLET | ORAL | Status: DC | PRN

## 2016-04-23 MED ORDER — TETANUS-DIPHTH-ACELL PERTUSSIS 5-2.5-18.5 LF-MCG/0.5 IM SUSP: 0.5000 mL | Freq: Once | INTRAMUSCULAR | Status: DC

## 2016-04-23 MED ORDER — OXYTOCIN 40 UNITS IN LACTATED RINGERS INFUSION - SIMPLE MED
2.5000 [IU]/h | INTRAVENOUS | Status: DC
  Administered 2016-04-23: 2.5 [IU]/h via INTRAVENOUS

## 2016-04-23 MED ORDER — OXYCODONE-ACETAMINOPHEN 5-325 MG PO TABS: 1.0000 | ORAL_TABLET | ORAL | Status: DC | PRN

## 2016-04-23 MED ORDER — LACTATED RINGERS IV SOLN: 500.0000 mL | Freq: Once | INTRAVENOUS | Status: DC

## 2016-04-23 MED ORDER — DIBUCAINE 1 % RE OINT
1.0000 | TOPICAL_OINTMENT | RECTAL | Status: DC | PRN
Start: 2016-04-23 — End: 2016-04-25

## 2016-04-23 MED ORDER — PHENYLEPHRINE 40 MCG/ML (10ML) SYRINGE FOR IV PUSH (FOR BLOOD PRESSURE SUPPORT)
80.0000 ug | PREFILLED_SYRINGE | INTRAVENOUS | Status: DC | PRN
  Filled 2016-04-23: qty 10
  Filled 2016-04-23: qty 5

## 2016-04-23 MED ORDER — TERBUTALINE SULFATE 1 MG/ML IJ SOLN
0.2500 mg | Freq: Once | INTRAMUSCULAR | Status: DC | PRN
  Filled 2016-04-23: qty 1

## 2016-04-23 MED ORDER — ACETAMINOPHEN 325 MG PO TABS: 650.0000 mg | ORAL_TABLET | ORAL | Status: DC | PRN

## 2016-04-23 MED ORDER — PHENYLEPHRINE 40 MCG/ML (10ML) SYRINGE FOR IV PUSH (FOR BLOOD PRESSURE SUPPORT)
80.0000 ug | PREFILLED_SYRINGE | INTRAVENOUS | Status: DC | PRN
  Filled 2016-04-23: qty 5

## 2016-04-23 MED ORDER — PHENYLEPHRINE 40 MCG/ML (10ML) SYRINGE FOR IV PUSH (FOR BLOOD PRESSURE SUPPORT): 80.0000 ug | PREFILLED_SYRINGE | INTRAVENOUS | Status: DC | PRN

## 2016-04-23 MED ORDER — ONDANSETRON HCL 4 MG/2ML IJ SOLN: 4.0000 mg | INTRAMUSCULAR | Status: DC | PRN

## 2016-04-23 MED ORDER — OXYTOCIN BOLUS FROM INFUSION
500.0000 mL | Freq: Once | INTRAVENOUS | Status: AC
  Administered 2016-04-23: 500 mL via INTRAVENOUS

## 2016-04-23 MED ORDER — LIDOCAINE HCL (PF) 1 % IJ SOLN
30.0000 mL | INTRAMUSCULAR | Status: DC | PRN
  Filled 2016-04-23: qty 30

## 2016-04-23 MED ORDER — LIDOCAINE HCL (PF) 1 % IJ SOLN
INTRAMUSCULAR | Status: DC | PRN
  Administered 2016-04-23: 3 mL via EPIDURAL
  Administered 2016-04-23: 4 mL via EPIDURAL

## 2016-04-23 MED ORDER — IBUPROFEN 600 MG PO TABS
600.0000 mg | ORAL_TABLET | Freq: Four times a day (QID) | ORAL | Status: DC
  Administered 2016-04-23 – 2016-04-25 (×8): 600 mg via ORAL
  Filled 2016-04-23 (×8): qty 1

## 2016-04-23 MED ORDER — EPHEDRINE 5 MG/ML INJ
10.0000 mg | INTRAVENOUS | Status: DC | PRN
  Filled 2016-04-23: qty 4

## 2016-04-23 MED ORDER — DIPHENHYDRAMINE HCL 50 MG/ML IJ SOLN: 12.5000 mg | INTRAMUSCULAR | Status: DC | PRN

## 2016-04-23 MED ORDER — FLEET ENEMA 7-19 GM/118ML RE ENEM: 1.0000 | ENEMA | RECTAL | Status: DC | PRN

## 2016-04-23 MED ORDER — LACTATED RINGERS IV SOLN
INTRAVENOUS | Status: DC
  Administered 2016-04-23: 125 mL/h via INTRAVENOUS

## 2016-04-23 MED ORDER — FENTANYL CITRATE (PF) 100 MCG/2ML IJ SOLN
50.0000 ug | INTRAMUSCULAR | Status: DC | PRN
  Filled 2016-04-23: qty 2

## 2016-04-23 MED ORDER — BENZOCAINE-MENTHOL 20-0.5 % EX AERO: 1.0000 "application " | INHALATION_SPRAY | CUTANEOUS | Status: DC | PRN

## 2016-04-23 MED ORDER — COCONUT OIL OIL: 1.0000 "application " | TOPICAL_OIL | Status: DC | PRN

## 2016-04-23 MED ORDER — SOD CITRATE-CITRIC ACID 500-334 MG/5ML PO SOLN: 30.0000 mL | ORAL | Status: DC | PRN

## 2016-04-23 MED ORDER — EPHEDRINE 5 MG/ML INJ: 10.0000 mg | INTRAVENOUS | Status: DC | PRN

## 2016-04-23 MED ORDER — SENNOSIDES-DOCUSATE SODIUM 8.6-50 MG PO TABS
2.0000 | ORAL_TABLET | ORAL | Status: DC
  Administered 2016-04-23 – 2016-04-24 (×2): 2 via ORAL
  Filled 2016-04-23 (×2): qty 2

## 2016-04-23 MED ORDER — OXYTOCIN 40 UNITS IN LACTATED RINGERS INFUSION - SIMPLE MED
1.0000 m[IU]/min | INTRAVENOUS | Status: DC
  Administered 2016-04-23: 2 m[IU]/min via INTRAVENOUS
  Filled 2016-04-23: qty 1000

## 2016-04-23 MED ORDER — ONDANSETRON HCL 4 MG PO TABS: 4.0000 mg | ORAL_TABLET | ORAL | Status: DC | PRN

## 2016-04-23 MED ORDER — SIMETHICONE 80 MG PO CHEW
80.0000 mg | CHEWABLE_TABLET | ORAL | Status: DC | PRN
Start: 2016-04-23 — End: 2016-04-25

## 2016-04-23 MED ORDER — LACTATED RINGERS IV SOLN
500.0000 mL | INTRAVENOUS | Status: DC | PRN
  Administered 2016-04-23: 500 mL via INTRAVENOUS

## 2016-04-23 MED ORDER — WITCH HAZEL-GLYCERIN EX PADS: 1.0000 "application " | MEDICATED_PAD | CUTANEOUS | Status: DC | PRN

## 2016-04-23 MED ORDER — ZOLPIDEM TARTRATE 5 MG PO TABS: 5.0000 mg | ORAL_TABLET | Freq: Every evening | ORAL | Status: DC | PRN

## 2016-04-23 MED ORDER — ONDANSETRON HCL 4 MG/2ML IJ SOLN
4.0000 mg | Freq: Four times a day (QID) | INTRAMUSCULAR | Status: DC | PRN
Start: 2016-04-23 — End: 2016-04-23

## 2016-04-23 MED ORDER — FENTANYL 2.5 MCG/ML BUPIVACAINE 1/10 % EPIDURAL INFUSION (WH - ANES)
14.0000 mL/h | INTRAMUSCULAR | Status: DC | PRN
  Administered 2016-04-23: 11 mL/h via EPIDURAL
  Administered 2016-04-23: 14 mL/h via EPIDURAL
  Filled 2016-04-23: qty 100

## 2016-04-23 MED ORDER — MEASLES, MUMPS & RUBELLA VAC ~~LOC~~ INJ: 0.5000 mL | INJECTION | Freq: Once | SUBCUTANEOUS | Status: DC

## 2016-04-23 NOTE — Lactation Note (Signed)
This note was copied from a baby's chart. Lactation Consultation Note  Patient Name: Amanda Meyer RusselHnghi Gunia UJWJX'BToday's Date: 04/23/2016 Reason for consult: Follow-up assessment Baby at 6 hr of life. Mom reports latching is going well. She denies breast or nipple pain, voiced no concerns. Upon entry baby was sleeping. Offered to help latch baby but parents declined. Dad said if baby gets cold right now she will get sick. Discussed baby behavior, feeding frequency, baby belly size, voids, wt loss, breast changes, and nipple care. Demonstrated manual expression, colostrum noted bilaterally. Parents are aware of lactation services and support group. Mom will call for lactation at the next feeding.     Maternal Data Has patient been taught Hand Expression?: Yes Does the patient have breastfeeding experience prior to this delivery?: Yes  Feeding Feeding Type: Breast Fed Length of feed: 5 min  LATCH Score/Interventions Latch: Grasps breast easily, tongue down, lips flanged, rhythmical sucking.  Audible Swallowing: None (only observed for the first minute )  Type of Nipple: Everted at rest and after stimulation  Comfort (Breast/Nipple): Soft / non-tender     Hold (Positioning): Assistance needed to correctly position infant at breast and maintain latch.  LATCH Score: 7  Lactation Tools Discussed/Used WIC Program: No   Consult Status Consult Status: Follow-up Date: 04/24/16 Follow-up type: In-patient    Rulon Eisenmengerlizabeth E Jabree Pernice 04/23/2016, 8:19 PM

## 2016-04-23 NOTE — Plan of Care (Signed)
Problem: Pain Management: Goal: General experience of comfort will improve and pain level will decrease Outcome: Progressing Patient would like to take Ibuprofen after eating; food ordered for patient.

## 2016-04-23 NOTE — MAU Provider Note (Signed)
Pt scheduled for external version, came in tonight for rule out labor.  Called to room to evaluate fetal position. Confirmed vertex by bedside US.  RN to notify attending.

## 2016-04-23 NOTE — Anesthesia Postprocedure Evaluation (Signed)
Anesthesia Post Note  Patient: Amanda Fischer  Procedure(s) Performed: * No procedures listed *  Patient location during evaluation: Mother Baby Anesthesia Type: Epidural Level of consciousness: awake, awake and alert and oriented Pain management: pain level controlled Vital Signs Assessment: post-procedure vital signs reviewed and stable Respiratory status: spontaneous breathing, nonlabored ventilation and respiratory function stable Cardiovascular status: blood pressure returned to baseline and stable Postop Assessment: no headache, no backache, no signs of nausea or vomiting, adequate PO intake and patient able to bend at knees Anesthetic complications: no        Last Vitals:  Vitals:   04/23/16 1524 04/23/16 1714  BP: 99/63 (!) 96/59  Pulse: 85 92  Resp: 17   Temp: 36.9 C 36.8 C    Last Pain:  Vitals:   04/23/16 1715  TempSrc:   PainSc: 4    Pain Goal:                 Maanav Kassabian

## 2016-04-23 NOTE — Anesthesia Pain Management Evaluation Note (Signed)
  CRNA Pain Management Visit Note  Patient: Amanda Fischer, 26 y.o., female  "Hello [facility] am a [facility]. We have an anesthesia team available at all times to provide care throughout the hospital, including epidural management and anesthesia for C-section. I don't know your plan for the delivery whether it a natural birth, water birth, IV sedation, nitrous supplementation, doula or epidural, but we want to meet your pain goals."   1.Was your pain managed to your expectations on prior hospitalizations?   Yes   2.What is your expectation for pain management during this hospitalization?     Epidural  3.How can we help you reach that goal? Pt wants epidural now  Record the patient's initial score and the patient's pain goal.   Pain: 10  Pain Goal: 10 [facility] wants you to be able to say your pain was always managed very well.  Prince Olivier 04/23/2016

## 2016-04-23 NOTE — MAU Note (Signed)
Pt reports contractions every 2-3 mins. Denies LOF or vag bleeding. +FM.

## 2016-04-23 NOTE — Lactation Note (Signed)
This note was copied from a baby's chart. Lactation Consultation Note  Patient Name: Amanda Fischer Today's Date: 04/23/2016 Reason for consult: Initial assessment Baby at 4hr of life. Upon entry dyad was sleeping. Left English handouts with Dad and instruction for mom to call for lactation at the next bf.   Maternal Data    Feeding Feeding Type: Breast Fed Length of feed: 60 min  LATCH Score/Interventions                      Lactation Tools Discussed/Used     Consult Status Consult Status: Follow-up Date: 04/23/16 Follow-up type: In-patient    Amanda Fischer 04/23/2016, 5:31 PM

## 2016-04-23 NOTE — Anesthesia Preprocedure Evaluation (Signed)
Anesthesia Evaluation  Patient identified by MRN, date of birth, ID band Patient awake    Reviewed: Allergy & Precautions, Patient's Chart, lab work & pertinent test results  Airway Mallampati: II  TM Distance: >3 FB     Dental no notable dental hx. (+) Teeth Intact   Pulmonary neg pulmonary ROS,    Pulmonary exam normal breath sounds clear to auscultation       Cardiovascular negative cardio ROS Normal cardiovascular exam Rhythm:Regular Rate:Normal     Neuro/Psych  Headaches, PSYCHIATRIC DISORDERS Depression    GI/Hepatic negative GI ROS, Neg liver ROS,   Endo/Other  negative endocrine ROS  Renal/GU negative Renal ROS  negative genitourinary   Musculoskeletal negative musculoskeletal ROS (+)   Abdominal   Peds  Hematology negative hematology ROS (+)   Anesthesia Other Findings   Reproductive/Obstetrics (+) Pregnancy                             Anesthesia Physical Anesthesia Plan  ASA: II  Anesthesia Plan: Epidural   Post-op Pain Management:    Induction:   Airway Management Planned: Natural Airway  Additional Equipment:   Intra-op Plan:   Post-operative Plan:   Informed Consent: I have reviewed the patients History and Physical, chart, labs and discussed the procedure including the risks, benefits and alternatives for the proposed anesthesia with the patient or authorized representative who has indicated his/her understanding and acceptance.     Plan Discussed with: Anesthesiologist  Anesthesia Plan Comments:         Anesthesia Quick Evaluation

## 2016-04-23 NOTE — H&P (Signed)
26 y.o. G2P1001 @ 4280w4d presents with painful contractions.  She was scheduled for an attempted external cephalic version for breech presentation today.  On arrival, she was 4/80 and vertex by the RNs exam.  The MAU provider confirmed vertex presentation by bedside ultrasound.  Otherwise has good fetal movement and no bleeding.  Pregnancy c/b: 1. Hemoglobin E trait  Past Medical History:  Diagnosis Date  . Depression    PPD  . Headache   . Hypotension     Past Surgical History:  Procedure Laterality Date  . TONSILLECTOMY      OB History  Gravida Para Term Preterm AB Living  2 1 1     1   SAB TAB Ectopic Multiple Live Births          1    # Outcome Date GA Lbr Len/2nd Weight Sex Delivery Anes PTL Lv  2 Current           1 Term 12/02/13 1565w2d / 00:19 3.02 kg (6 lb 10.5 oz) F Vag-Spont None  LIV      Social History   Social History  . Marital status: Married    Spouse name: N/A  . Number of children: N/A  . Years of education: N/A   Occupational History  . Not on file.   Social History Main Topics  . Smoking status: Never Smoker  . Smokeless tobacco: Not on file  . Alcohol use No  . Drug use: No  . Sexual activity: Yes    Partners: Male     Comment: married   Other Topics Concern  . Not on file   Social History Narrative  . No narrative on file   Patient has no known allergies.    Prenatal Transfer Tool  Maternal Diabetes: No Genetic Screening: Normal Quad screen low risk Maternal Ultrasounds/Referrals: Normal Fetal Ultrasounds or other Referrals:  None Maternal Substance Abuse:  No Significant Maternal Medications:  None Significant Maternal Lab Results: Lab values include: Group B Strep negative, hgb E trait  ABO, Rh: O/Positive/-- (09/22 0000) Antibody: Negative (09/22 0000) Rubella: Immune (09/22 0000) RPR: Nonreactive (09/22 0000)  HBsAg: Negative (09/22 0000)  HIV: Non-reactive (09/22 0000)  GBS: Negative (02/07 0000)        Vitals:   04/23/16 0558  BP: 94/68  Pulse: 73  Resp: 20  Temp: 97.8 F (36.6 C)     General:  NAD Abdomen:  soft, gravid, EFW 6# SVE:  4/80/-2 per RN FHTs:  140s, mod var, + accels Toco:  q2-3 min  Growth US on 3/3: EFW 5lb 15oz  A/P   26 y.o. G2P1001 4880w4d presents with labor Cephalic presentation confirmed by US Admit to L&D FSR/ GBS neg  [facility] GEFFEL Jaida Basurto

## 2016-04-23 NOTE — Anesthesia Procedure Notes (Signed)
Epidural Patient location during procedure: OB Start time: 04/23/2016 9:01 AM  Staffing Anesthesiologist: Mal AmabileFOSTER, Laraina Sulton Performed: anesthesiologist   Preanesthetic Checklist Completed: patient identified, site marked, surgical consent, pre-op evaluation, timeout performed, IV checked, risks and benefits discussed and monitors and equipment checked  Epidural Patient position: sitting Prep: site prepped and draped and DuraPrep Patient monitoring: continuous pulse ox and blood pressure Approach: midline Location: L3-L4 Injection technique: LOR air  Needle:  Needle type: Tuohy  Needle gauge: 17 G Needle length: 9 cm and 9 Needle insertion depth: 4 cm Catheter type: closed end flexible Catheter size: 19 Gauge Catheter at skin depth: 9 cm Test dose: negative and Other  Assessment Events: blood not aspirated, injection not painful, no injection resistance, negative IV test and no paresthesia  Additional Notes Patient identified. Risks and benefits discussed including failed block, incomplete  Pain control, post dural puncture headache, nerve damage, paralysis, blood pressure Changes, nausea, vomiting, reactions to medications-both toxic and allergic and post Partum back pain. All questions were answered. Patient expressed understanding and wished to proceed. Sterile technique was used throughout procedure. Epidural site was Dressed with sterile barrier dressing. No paresthesias, signs of intravascular injection Or signs of intrathecal spread were encountered.  Patient was more comfortable after the epidural was dosed.  Please see RN's note for documentation of vital signs and FHR which are stable.

## 2016-04-23 NOTE — Plan of Care (Signed)
Problem: Education: Goal: Knowledge of condition will improve Outcome: Progressing Educated about bladder; cramping; and bleeding.  FOB interpreting for patient as requested.

## 2016-04-24 LAB — RPR: RPR: NONREACTIVE

## 2016-04-24 MED ORDER — GUAIFENESIN ER 600 MG PO TB12
600.0000 mg | ORAL_TABLET | Freq: Two times a day (BID) | ORAL | Status: DC
  Administered 2016-04-24 – 2016-04-25 (×3): 600 mg via ORAL
  Filled 2016-04-24 (×5): qty 1

## 2016-04-24 NOTE — Progress Notes (Signed)
Post Partum Day 1 Subjective: voiding, tolerating PO and feels "cold symptoms, body aches"   Objective: Blood pressure 104/70, pulse 79, temperature 99.1 F (37.3 C), temperature source Oral, resp. rate 16, height 4\' 11"  (1.499 m), weight 52.2 kg (115 lb), last menstrual period 08/01/2015, SpO2 99 %, unknown if currently breastfeeding.  Physical Exam:  General: alert, cooperative and mild distress Lochia: appropriate Uterine Fundus: firm Perineum: healing well, no significant drainage, no dehiscence, no significant erythema DVT Evaluation: No evidence of DVT seen on physical exam. Negative Homan's sign. No cords or calf tenderness. No significant calf/ankle edema.   Recent Labs  04/23/16 0615  HGB 11.5*  HCT 34.5*    Assessment/Plan: Plan for discharge tomorrow and Breast and bottle feeding Will prescribe Mucinex or a cold medicine on formulary   LOS: 1 day   Ryan Ogborn STACIA 04/24/2016, 10:39 AM

## 2016-04-24 NOTE — Lactation Note (Signed)
This note was copied from a baby's chart. Lactation Consultation Note Follow up visit at 30 hours of age.  Mom is asleep and FOB is holding baby in arms.  FOB reports just giving baby about 2.705mls after larger feeding 1 hours previous.  FOB points to bottle at bedside and when asked reports baby has been fed from this bottle a few times.  LC educated parents on use of bottle for only 1 hours and then needs to be discarded.  LC explained for use in the hospital and when at home to help keep baby healthy.  FOB reports understanding.  FOB reports mom doesn't have milk so she will latch the baby for about 5 minutes and then bottle feed formula.  LC to follow as needed.      Patient Name: Amanda Fischer IEPPI'RToday's Date: 04/24/2016 Reason for consult: Follow-up assessment   Maternal Data    Feeding Feeding Type: Bottle Fed - Formula Length of feed: 5 min  LATCH Score/Interventions                      Lactation Tools Discussed/Used     Consult Status Consult Status: Follow-up Date: 04/25/16 Follow-up type: In-patient    Cielle Aguila, Arvella MerlesJana Lynn 04/24/2016, 7:36 PM

## 2016-04-25 NOTE — Discharge Summary (Signed)
Obstetric Discharge Summary Reason for Admission: onset of labor Prenatal Procedures: ultrasound Intrapartum Procedures: spontaneous vaginal delivery Postpartum Procedures: none Complications-Operative and Postpartum: none Hemoglobin  Date Value Ref Range Status  04/23/2016 11.5 (L) 12.0 - 15.0 g/dL Final   HCT  Date Value Ref Range Status  04/23/2016 34.5 (L) 36.0 - 46.0 % Final    Physical Exam:  General: alert and cooperative Lochia: appropriate Uterine Fundus: firm DVT Evaluation: No evidence of DVT seen on physical exam.  Discharge Diagnoses: Term Pregnancy-delivered  Discharge Information: Date: 04/25/2016 Activity: pelvic rest Diet: routine Medications: PNV and Ibuprofen Condition: stable Instructions: refer to practice specific booklet Discharge to: home Follow-up Information    Levi AlandANDERSON,MARK E, MD Follow up in 4 week(s).   Specialty:  Obstetrics and Gynecology Contact information: [address] [place] [phone]           Newborn Data: Live born female  Birth Weight: 6 lb 2.6 oz (2795 g) APGAR: 9, 9  Home with mother.  Philip AspenCALLAHAN, Sherie Dobrowolski 04/25/2016, 9:52 AM

## 2016-09-03 ENCOUNTER — Ambulatory Visit: Payer: Medicaid Other | Admitting: Family Medicine

## 2016-10-19 ENCOUNTER — Encounter: Payer: Self-pay | Admitting: Family Medicine

## 2016-10-19 ENCOUNTER — Ambulatory Visit (INDEPENDENT_AMBULATORY_CARE_PROVIDER_SITE_OTHER): Payer: PRIVATE HEALTH INSURANCE | Admitting: Family Medicine

## 2016-10-19 VITALS — BP 89/66 | HR 78 | Temp 97.6°F | Resp 18 | Ht 59.0 in | Wt 108.6 lb

## 2016-10-19 DIAGNOSIS — R110 Nausea: Secondary | ICD-10-CM

## 2016-10-19 DIAGNOSIS — R102 Pelvic and perineal pain: Secondary | ICD-10-CM

## 2016-10-19 DIAGNOSIS — R1084 Generalized abdominal pain: Secondary | ICD-10-CM

## 2016-10-19 LAB — POCT CBC
Granulocyte percent: 64.4 %G (ref 37–80)
HEMATOCRIT: 39.4 % (ref 37.7–47.9)
HEMOGLOBIN: 12.8 g/dL (ref 12.2–16.2)
LYMPH, POC: 1.7 (ref 0.6–3.4)
MCH, POC: 25.7 pg — AB (ref 27–31.2)
MCHC: 32.5 g/dL (ref 31.8–35.4)
MCV: 78.9 fL — AB (ref 80–97)
MID (cbc): 0.6 (ref 0–0.9)
MPV: 7.9 fL (ref 0–99.8)
POC GRANULOCYTE: 4 (ref 2–6.9)
POC LYMPH %: 26.7 % (ref 10–50)
POC MID %: 8.9 % (ref 0–12)
Platelet Count, POC: 319 10*3/uL (ref 142–424)
RBC: 4.99 M/uL (ref 4.04–5.48)
RDW, POC: 13.9 %
WBC: 6.2 10*3/uL (ref 4.6–10.2)

## 2016-10-19 LAB — POCT WET + KOH PREP
TRICH BY WET PREP: ABSENT
YEAST BY KOH: ABSENT
Yeast by wet prep: ABSENT

## 2016-10-19 LAB — POCT URINALYSIS DIP (MANUAL ENTRY)
Bilirubin, UA: NEGATIVE
Glucose, UA: NEGATIVE mg/dL
Ketones, POC UA: NEGATIVE mg/dL
LEUKOCYTES UA: NEGATIVE
NITRITE UA: NEGATIVE
PH UA: 7 (ref 5.0–8.0)
PROTEIN UA: NEGATIVE mg/dL
RBC UA: NEGATIVE
Spec Grav, UA: 1.015 (ref 1.010–1.025)
Urobilinogen, UA: 0.2 E.U./dL

## 2016-10-19 LAB — POCT URINE PREGNANCY: Preg Test, Ur: NEGATIVE

## 2016-10-19 MED ORDER — ONDANSETRON HCL 4 MG PO TABS: 4.0000 mg | ORAL_TABLET | Freq: Three times a day (TID) | ORAL | 0 refills | Status: DC | PRN

## 2016-10-19 MED ORDER — NAPROXEN 500 MG PO TABS: 500.0000 mg | ORAL_TABLET | Freq: Two times a day (BID) | ORAL | 0 refills | Status: DC

## 2016-10-19 NOTE — Progress Notes (Signed)
Chief Complaint  Patient presents with  . Abdominal Pain    x 1week   . Nausea    x 1 week     HPI  Translating: Fayrene FearingJames who is her spouse She is here today with lower abdominal pain and nausea with one episode of vomiting for the past week  She reports that she had a fever 3 days ago She has a recent pregnancy and is now almost 6 months postpartum Sick contacts include a 3y with fevers at home   She stopped breast feeding 2 months ago She resumed menses one months ago Patient's last menstrual period was 09/12/2016 (approximate).  Past Medical History:  Diagnosis Date  . Depression    PPD  . Headache   . Hypotension     Current Outpatient Medications  Medication Sig Dispense Refill  . naproxen (NAPROSYN) 500 MG tablet Take 1 tablet (500 mg total) by mouth 2 (two) times daily with a meal. 30 tablet 0  . ondansetron (ZOFRAN) 4 MG tablet Take 1 tablet (4 mg total) by mouth every 8 (eight) hours as needed for nausea or vomiting. 20 tablet 0   No current facility-administered medications for this visit.     Allergies: No Known Allergies  Past Surgical History:  Procedure Laterality Date  . TONSILLECTOMY      Social History   Socioeconomic History  . Marital status: Married    Spouse name: None  . Number of children: None  . Years of education: None  . Highest education level: None  Social Needs  . Financial resource strain: None  . Food insecurity - worry: None  . Food insecurity - inability: None  . Transportation needs - medical: None  . Transportation needs - non-medical: None  Occupational History  . None  Tobacco Use  . Smoking status: Never Smoker  . Smokeless tobacco: Never Used  Substance and Sexual Activity  . Alcohol use: No  . Drug use: No  . Sexual activity: Yes    Partners: Male    Comment: married  Other Topics Concern  . None  Social History Narrative  . None   History reviewed. No pertinent family history.   Review of Systems    Eyes: Negative for blurred vision and double vision.  Respiratory: Negative for cough and wheezing.   Cardiovascular: Negative for chest pain and palpitations.  Genitourinary: Negative for dysuria and urgency.  Neurological: Negative for tingling, tremors and headaches.  Endo/Heme/Allergies: Negative for environmental allergies. Does not bruise/bleed easily.      Objective: Vitals:   10/19/16 1054 10/19/16 1141  BP: (!) 88/60 (!) 89/66  Pulse: 85 78  Resp: 18   Temp: (!) 97.2 F (36.2 C) 97.6 F (36.4 C)  TempSrc: Oral Oral  SpO2: 96%   Weight: 108 lb 9.6 oz (49.3 kg)   Height: 4\' 11"  (1.499 m)     Physical Exam  Physical Exam  Constitutional: She is oriented to person, place, and time. She appears well-developed and well-nourished.  HENT:  Head: Normocephalic and atraumatic.  Eyes: Conjunctivae and EOM are normal.  Cardiovascular: Normal rate, regular rhythm and normal heart sounds.   Pulmonary/Chest: Effort normal and breath sounds normal. No respiratory distress. She has no wheezes.  Neurological: She is alert and oriented to person, place, and time.    Lab Results  Component Value Date   WBC 6.2 10/19/2016   HGB 12.8 10/19/2016   HCT 39.4 10/19/2016   MCV 78.9 (A) 10/19/2016  PLT 311 04/23/2016    Patient's last menstrual period was 09/12/2016 (approximate).  Assessment and Plan Guliana was seen today for abdominal pain and nausea.  Diagnoses and all orders for this visit:  Generalized abdominal pain -     POCT urinalysis dipstick -     POCT urine pregnancy -     POCT CBC -     POCT Wet + KOH Prep  Nausea -     POCT urine pregnancy  Pelvic pain  Other orders -     Cancel: POCT CBC -     Cancel: Pap IG w/ reflex to HPV when ASC-U -     ondansetron (ZOFRAN) 4 MG tablet; Take 1 tablet (4 mg total) by mouth every 8 (eight) hours as needed for nausea or vomiting. -     naproxen (NAPROSYN) 500 MG tablet; Take 1 tablet (500 mg total) by mouth 2 (two)  times daily with a meal.  Urine dip negative hcg negative Cbc normal Wet prep showing BV   Iceis Knab A Tynesha Free

## 2016-10-19 NOTE — Patient Instructions (Addendum)
Return in 5 days if symptoms have not resolved If there is worsening pain go to the closest ER   IF you received an x-ray today, you will receive an invoice from [facility]. Please contact [facility] at [phone] with questions or concerns regarding your invoice.   IF you received labwork today, you will receive an invoice from [facility]. Please contact [facility] at [phone] with questions or concerns regarding your invoice.   Our billing staff will not be able to assist you with questions regarding bills from these companies.  You will be contacted with the lab results as soon as they are available. The fastest way to get your results is to activate your My Chart account. Instructions are located on the last page of this paperwork. If you have not heard from us regarding the results in 2 weeks, please contact this office.      ?au b?ng, Ng??i l?n Abdominal Pain, Adult Nhi?u nguyn nhn c th? gy ra ?au b?ng. ?au b?ng th??ng khng nghim tr?ng v ?? m khng c?n ph?i ?i?u tr? ho?c khi ???c ?i?u tr? t?i nh. Tuy nhin, ?i khi ?au b?ng l nghim tr?ng. Chuyn gia ch?m [place] s?c kh?e c?a qu v? s? khai thc b?nh s? v khm th?c th? ?? xc ??nh nguyn nhn gy ra ?au b?ng. Tun th? nh?ng h??ng d?n ny ? nh:  Ch? s? d?ng thu?c khng k ??n v thu?c k ??n theo ch? d?n c?a chuyn gia ch?m [place] s?c kh?e. Khng dng thu?c nhu?n trng tr? khi ???c chuyn gia ch?m [place] s?c kh?e c?a qu v? ch? d?n.  U?ng ?? n??c ?? gi? cho n??c ti?u trong ho?c c mu vng nh?t.  Theo di tnh tr?ng c?a qu v? ?? pht hi?n b?t k? thay ??i no.  Tun th? t?t c? cc cu?c h?n khm l?i theo ch? d?n c?a chuyn gia ch?m [place] s?c kh?e. ?i?u ny c vai tr quan tr?ng. Hy lin l?c v?i chuyn gia ch?m [place] s?c kh?e n?u:  Tnh tr?ng ?au b?ng c?a qu v? thay ??i ho?c tr?m tr?ng h?n.  Qu v? khng ?i ho?c qu v? b? s?t cn m khng c? g?ng gi?m cn.  Qu v? b? to bn ho?c b? tiu ch?y qu 2-3  ngy.  Qu v? b? ?au khi ?i ti?u ho?c ?i ngoi.  ?au b?ng khi?n qu v? t?nh gi?c vo ban ?m.  ?au tr? nn tr?m tr?ng h?n khi ?n, sau khi ?n, ho?c khi ?n m?t s? th?c ph?m nh?t ??nh.  Qu v? nn v khng th? km l?i ???c.  Qu v? b? s?t. Yu c?u tr? gip ngay l?p t?c n?u:  C?n ?au c?a qu v? khng h?t s?m theo nh? d? ki?n c?a chuyn gia ch?m [place] s?c kh?e c?a qu v?.  Qu v? v?n ti?p t?c b? nn.  Ch? c?m th?y ?au ? cc vng c?a b?ng, ch?ng h?n nh? ? ph?n b?ng d??i bn ph?i ho?c bn tri.  Quy? vi? ?i ngoi phn c ma?u ho??c phn ma?u ?en, ho??c phn trng nh? h??c i?n.  Qu v? b? ?au r?t nhi?u, co th?t ho??c ch???ng b?ng.  Qu v? c cc d?u hi?u m?t n??c, ch?ng h?n nh?: ? N??c ti?u s?m mu, r?t t n??c ti?u, ho?c khng c n??c ti?u. ? Mi n?t n?. ? Mi?ng kh. ? M?t tr?ng. ? Bu?n ng?. ? Y?u. Thng tin ny khng nh?m m?c ?ch thay th? cho l?i khuyn m chuyn gia ch?m  s?c kh?e ni  v?i qu v?. Hy b?o ??m qu v? ph?i th?o lu?n b?t k? v?n ?? g m qu v? c v?i chuyn gia ch?m  s?c kh?e c?a qu v?. Document Released: 02/05/2005 Document Revised: 01/26/2016 Document Reviewed: 07/20/2015 Elsevier Interactive Patient Education  2017 [employer].

## 2017-01-14 IMAGING — CR DG ABDOMEN 2V
2 series · 2 of 2 positions shown · non-contrast
Comparison: None.

CLINICAL DATA: Abdominal pain

EXAM:
ABDOMEN - 2 VIEW

[AP (1 of 2)]
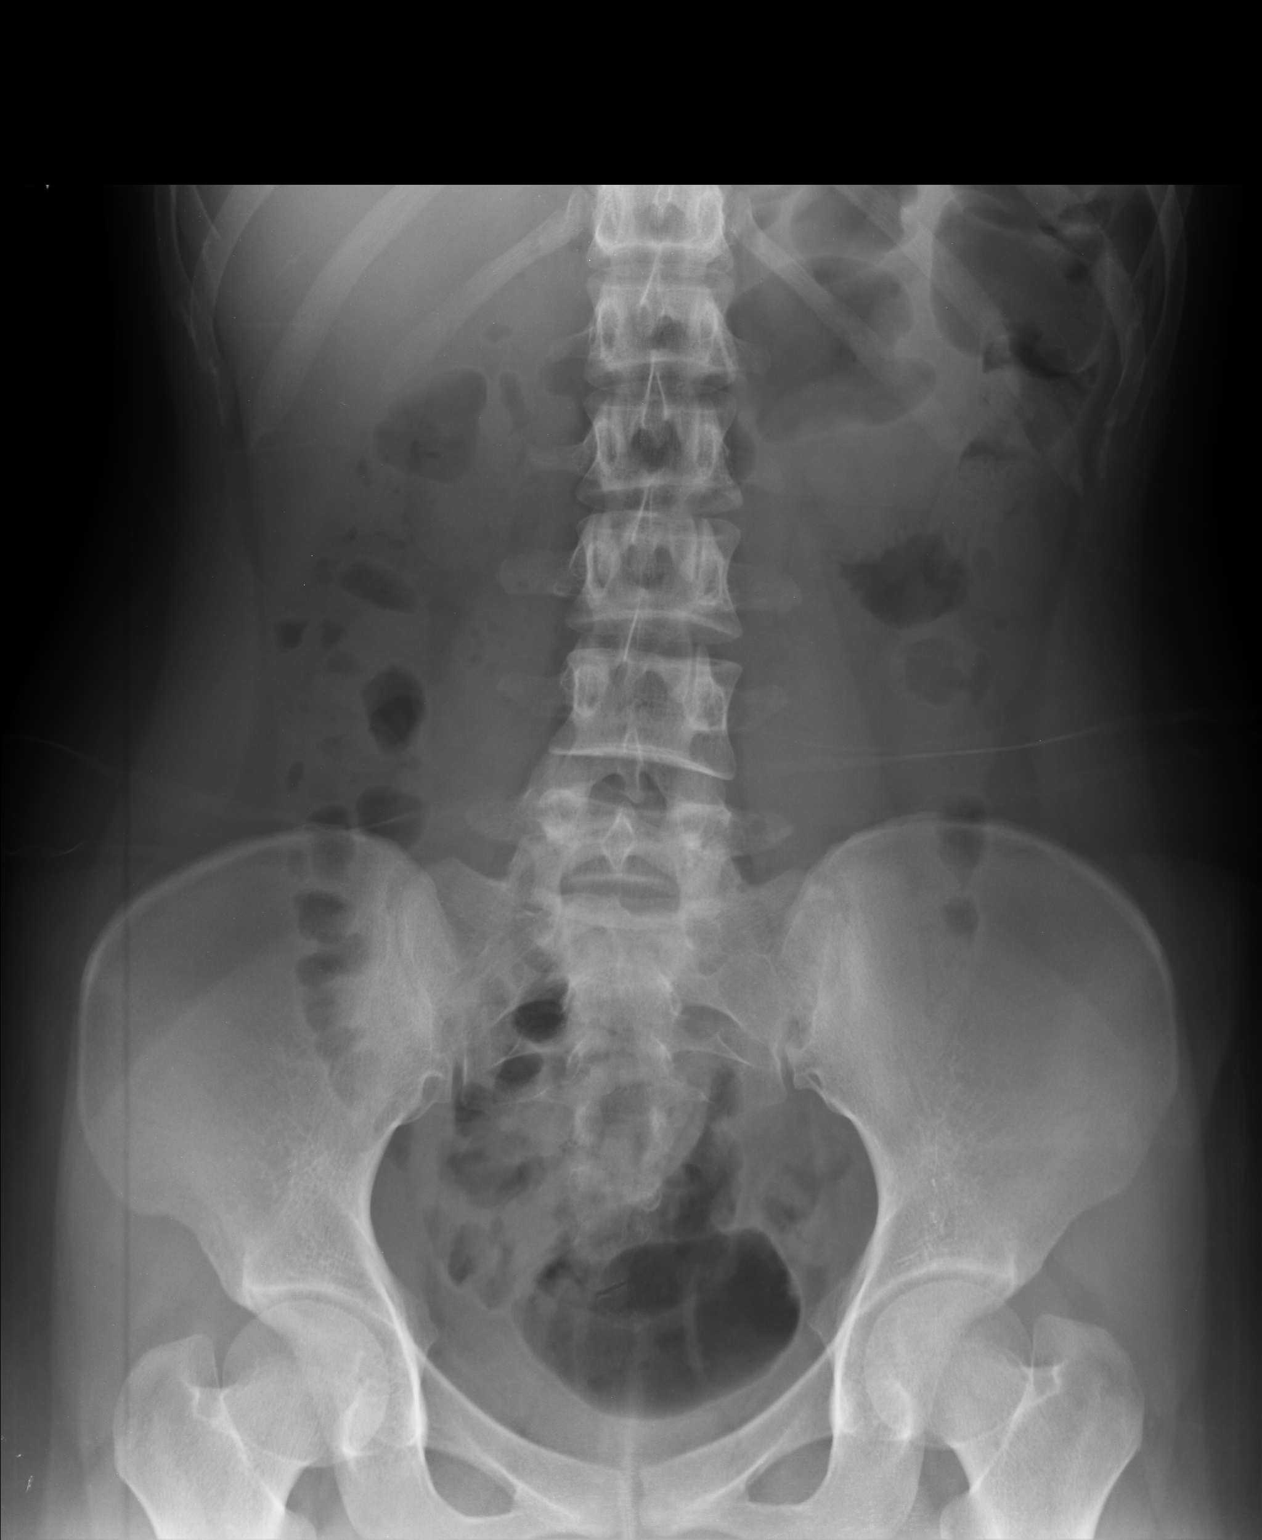

[AP (2 of 2)]
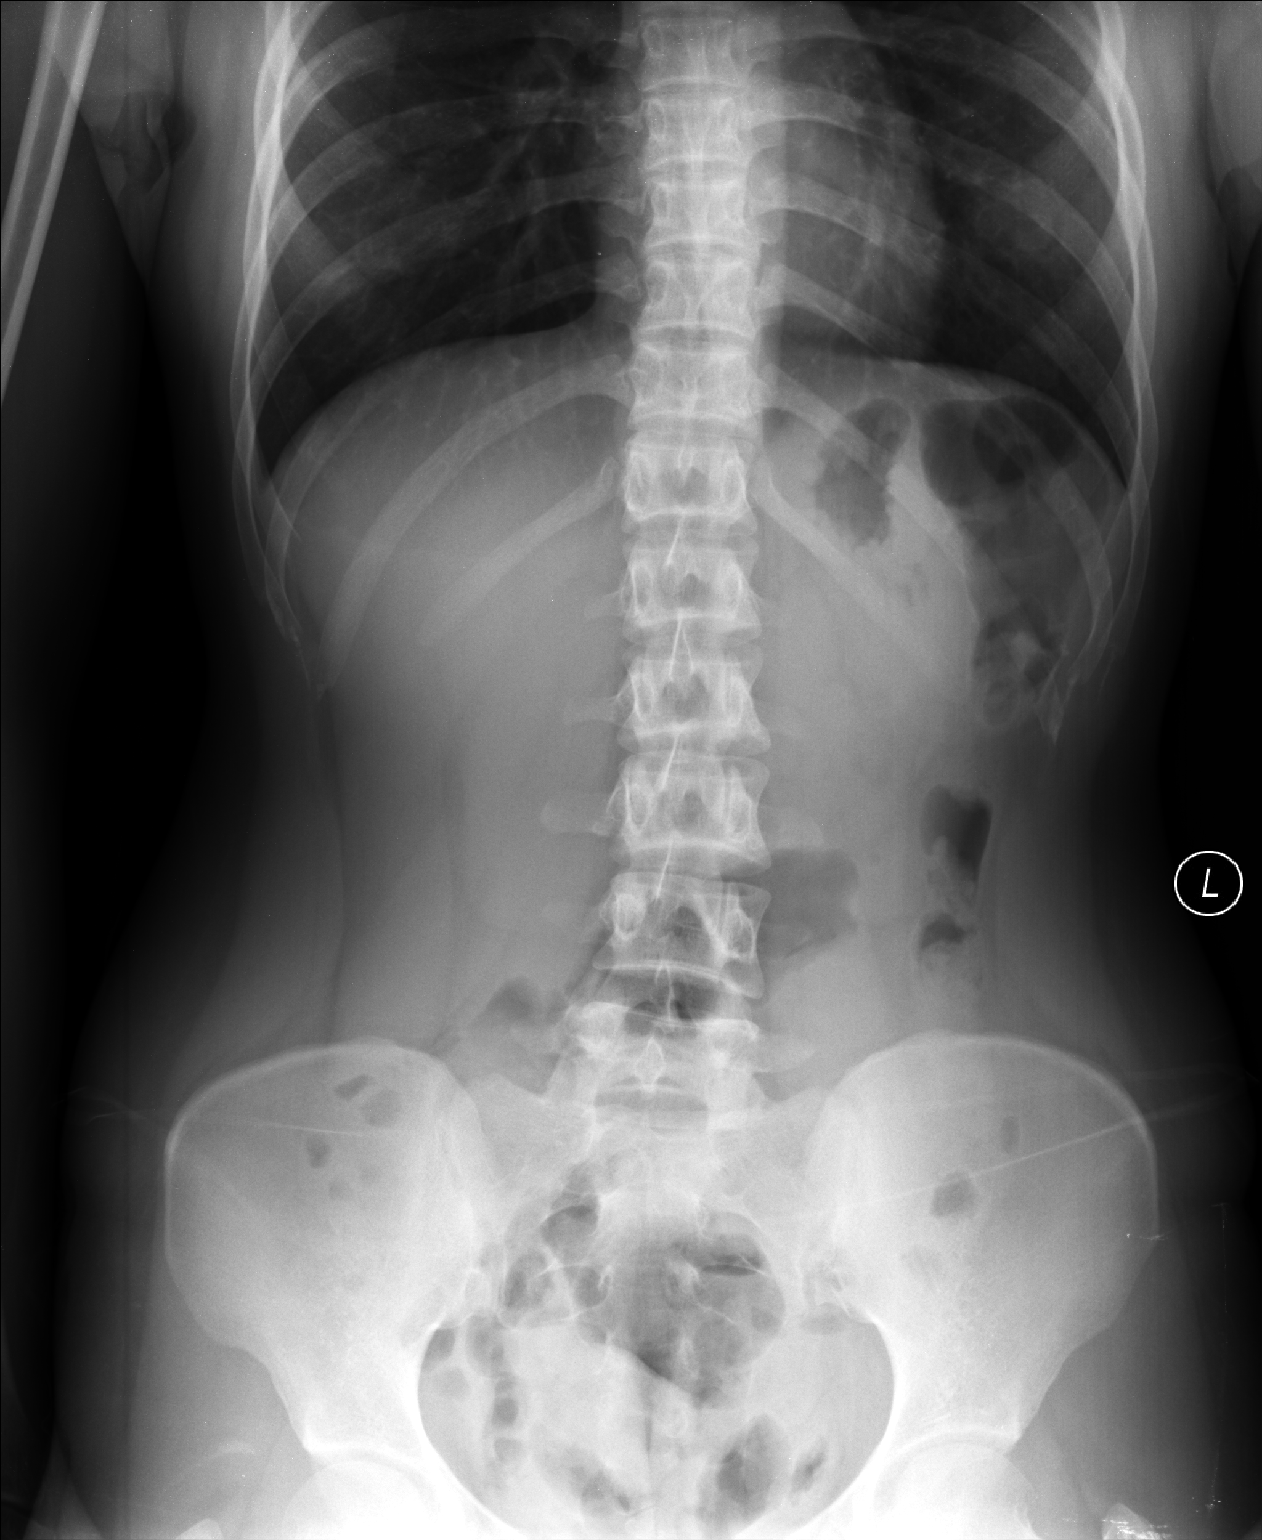

[2 of 2 positions shown; findings below may reference images not displayed]

FINDINGS: Supine and upright images were obtained. There is moderate stool in
the colon. There is no bowel dilatation or air-fluid level
suggesting obstruction. No free air. No abnormal calcifications.
Lung bases are clear.
IMPRESSION: No obstruction or free air. Moderate stool in colon. Lung bases
clear.

## 2019-02-20 NOTE — L&D Delivery Note (Signed)
Delivery Note At 12:06 PM a viable and healthy female was delivered via Vaginal, Spontaneous (Presentation: Left Occiput Anterior).  APGAR: 8, 9; weight 6 lb 12.1 oz (3065 g).   Placenta status: Spontaneous, Intact.  Cord: 3 vessels  Anesthesia: Local Episiotomy: None Lacerations: 2nd degree Suture Repair: 3.0 vicryl Est. Blood Loss (mL): 397 cc  Mom to postpartum.  Baby to Couplet care / Skin to Skin.  Waynard Reeds 12/04/2019, 1:39 PM

## 2019-04-07 ENCOUNTER — Encounter (HOSPITAL_COMMUNITY): Payer: Self-pay | Admitting: Obstetrics and Gynecology

## 2019-04-07 ENCOUNTER — Inpatient Hospital Stay (HOSPITAL_COMMUNITY)
Admission: AD | Admit: 2019-04-07 | Discharge: 2019-04-07 | Disposition: A | Discharge status: Home / Self Care | Payer: Medicaid Other | Attending: Obstetrics and Gynecology | Admitting: Obstetrics and Gynecology

## 2019-04-07 ENCOUNTER — Inpatient Hospital Stay (HOSPITAL_COMMUNITY): Payer: Medicaid Other

## 2019-04-07 ENCOUNTER — Other Ambulatory Visit: Payer: Self-pay

## 2019-04-07 DIAGNOSIS — O26892 Other specified pregnancy related conditions, second trimester: Secondary | ICD-10-CM

## 2019-04-07 DIAGNOSIS — O219 Vomiting of pregnancy, unspecified: Secondary | ICD-10-CM | POA: Insufficient documentation

## 2019-04-07 DIAGNOSIS — R109 Unspecified abdominal pain: Secondary | ICD-10-CM | POA: Insufficient documentation

## 2019-04-07 DIAGNOSIS — Z3A01 Less than 8 weeks gestation of pregnancy: Secondary | ICD-10-CM

## 2019-04-07 DIAGNOSIS — O26891 Other specified pregnancy related conditions, first trimester: Secondary | ICD-10-CM | POA: Diagnosis not present

## 2019-04-07 DIAGNOSIS — Z791 Long term (current) use of non-steroidal anti-inflammatories (NSAID): Secondary | ICD-10-CM | POA: Insufficient documentation

## 2019-04-07 DIAGNOSIS — Z3490 Encounter for supervision of normal pregnancy, unspecified, unspecified trimester: Secondary | ICD-10-CM

## 2019-04-07 LAB — URINALYSIS, ROUTINE W REFLEX MICROSCOPIC
Bacteria, UA: NONE SEEN
Bilirubin Urine: NEGATIVE
Glucose, UA: NEGATIVE mg/dL
Hgb urine dipstick: NEGATIVE
Ketones, ur: NEGATIVE mg/dL
Nitrite: NEGATIVE
Protein, ur: NEGATIVE mg/dL
Specific Gravity, Urine: 1.005 (ref 1.005–1.030)
pH: 7 (ref 5.0–8.0)

## 2019-04-07 LAB — CBC
HCT: 36.6 % (ref 36.0–46.0)
Hemoglobin: 12.1 g/dL (ref 12.0–15.0)
MCH: 27.2 pg (ref 26.0–34.0)
MCHC: 33.1 g/dL (ref 30.0–36.0)
MCV: 82.2 fL (ref 80.0–100.0)
Platelets: 288 10*3/uL (ref 150–400)
RBC: 4.45 MIL/uL (ref 3.87–5.11)
RDW: 13.3 % (ref 11.5–15.5)
WBC: 7.1 10*3/uL (ref 4.0–10.5)
nRBC: 0 % (ref 0.0–0.2)

## 2019-04-07 LAB — COMPREHENSIVE METABOLIC PANEL
ALT: 9 U/L (ref 0–44)
AST: 14 U/L — ABNORMAL LOW (ref 15–41)
Albumin: 3.7 g/dL (ref 3.5–5.0)
Alkaline Phosphatase: 32 U/L — ABNORMAL LOW (ref 38–126)
Anion gap: 6 (ref 5–15)
BUN: 5 mg/dL — ABNORMAL LOW (ref 6–20)
CO2: 22 mmol/L (ref 22–32)
Calcium: 9 mg/dL (ref 8.9–10.3)
Chloride: 107 mmol/L (ref 98–111)
Creatinine, Ser: 0.71 mg/dL (ref 0.44–1.00)
GFR calc Af Amer: >60 mL/min (ref >60)
GFR calc non Af Amer: >60 mL/min (ref >60)
Glucose, Bld: 87 mg/dL (ref 70–99)
Potassium: 3.4 mmol/L — ABNORMAL LOW (ref 3.5–5.1)
Sodium: 135 mmol/L (ref 135–145)
Total Bilirubin: 0.5 mg/dL (ref 0.3–1.2)
Total Protein: 7 g/dL (ref 6.5–8.1)

## 2019-04-07 LAB — WET PREP, GENITAL
Clue Cells Wet Prep HPF POC: NONE SEEN
Sperm: NONE SEEN
Trich, Wet Prep: NONE SEEN
Yeast Wet Prep HPF POC: NONE SEEN

## 2019-04-07 LAB — HCG, QUANTITATIVE, PREGNANCY: hCG, Beta Chain, Quant, S: 54541 m[IU]/mL — ABNORMAL HIGH (ref <5)

## 2019-04-07 LAB — POCT PREGNANCY, URINE: Preg Test, Ur: POSITIVE — AB

## 2019-04-07 MED ORDER — ONDANSETRON 8 MG PO TBDP: 8.0000 mg | ORAL_TABLET | Freq: Three times a day (TID) | ORAL | 1 refills | Status: DC | PRN

## 2019-04-07 MED ORDER — PROMETHAZINE HCL 12.5 MG PO TABS: 12.5000 mg | ORAL_TABLET | Freq: Four times a day (QID) | ORAL | 0 refills | Status: DC | PRN

## 2019-04-07 NOTE — MAU Note (Signed)
.   Amanda Fischer is a 29 y.o. at [redacted]w[redacted]d here in MAU reporting: patient reports nausea and vomiting since yesterday accompanied by dizziness. She also reports lower abdominal cramping. No VB but reports increase in clear watery discharge.  Pain score: 6 Vitals:   04/07/19 1308 04/07/19 1315  BP: (!) 111/59   Pulse: (!) 116   Resp: 16   Temp:  98.4 F (36.9 C)  SpO2: 100%

## 2019-04-07 NOTE — Discharge Instructions (Signed)
-call [phone] in order to schedule prenatal visit in 6 weeks.  -Be sure to keep drinking liquids for nausea and vomiting,  -Return to hospital if start having strong cramps or bleeding    Ch?ng nn nhi?u do nghn Hyperemesis Gravidarum Ch?ng nn nhi?u do nghn l m?t d?ng bu?n nn v nn n?ng x?y ra trong th?i k? mang [place]. Ch?ng nn nhi?u tr?m tr?ng h?n tnh tr?ng ?m nghn. N c th? khi?n cho qu v? b? bu?n nn ho?c nn su?t c? ngy trong nhi?u ngy. N c th? khi?n qu v? khng ?n ?? th?c ?n v khng u?ng ?? ?? l?ng, t? ? c th? d?n ??n m?t n??c, suy dinh d??ng v s?t cn. Ch?ng nn nhi?u th??ng x?y ra trong n?a ??u (20 tu?n ??u) c?a [place] k?. N th??ng h?t sau khi ng??i mang [place] ? n?a sau c?a [place] k?. Tuy nhin, ?i khi ch?ng nn nhi?u ti?p t?c trong su?t ton b? th?i k? mang [place]. Nguyn nhn g gy ra? Khng r nguyn nhn gy ra tnh tr?ng ny. N c th? lin quan ??n s? thay ??i v? cc ch?t ha h?c (hc mn) trong c? th? trong [place] k?, ch?ng h?n nh? l??ng hc mn [place] k? cao (kch t? sinh d?c mng ??m ? ng??i) ho?c s? gia t?ng hc mn sinh d?c n? (estrogen). Cc d?u hi?u ho?c tri?u ch?ng l g? Nh?ng tri?u ch?ng c?a tnh tr?ng ny bao g?m:  Khng h?t bu?n nn.  Nn m?a khi?n qu v? khng th? gi? ???c b?t k? lo?i th?c ?n no trong c? th?.  S?t cn.  M?t n??c trong c? th? (m?t n??c).  Khng mu?n ?n, ho?c khng thch th?c ?n m tr??c ?y qu v? r?t thch. Ch?n ?on tnh tr?ng ny nh? th? no? Tnh tr?ng ny c th? ???c ch?n ?on d?a vo:  Khm th?c th?.  B?nh s? c?a qu v?.  Tri?u ch?ng c?a qu v?.  Xt nghi?m mu.  Xt nghi?m n??c ti?u. Tnh tr?ng ny ???c ?i?u tr? nh? th? no? Tnh tr?ng ny ???c x? tr b?ng cch ki?m sot tri?u ch?ng. ?i?u ny c th? bao g?m:  Tun theo m?t k? ho?ch ?n u?ng. Vi?c ny c th? gip gi?m bu?n nn v nn.  Dng thu?c k ??n. M?t k? ho?ch ?n u?ng v thu?c th??ng ???c s? d?ng cng [place] ?? gip ki?m sot tri?u ch?ng. N?u thu?c khng  gip gi?m bu?n nn v nn, qu v? c th? c?n ???c truy?n d?ch qua m?t ???ng truy?n t?nh m?ch t?i b?nh vi?n. Tun th? nh?ng h??ng d?n ny ? nh: ?n v u?ng   Tra?nh nh??ng v?n ?? sau: ? U?ng n??c trong b?a ?n. C? g?ng khng u?ng b?t c? th? g trong 30 pht tr??c v sau b?a ?n. ? U?ng nhi?u h?n 1 c?c ?? l?ng trong m?t lc. ? ?n cc th?c ?n gy kh?i pht tri?u ch?ng. Nh?ng th?c ?n ny c th? bao g?m ?? ?n cay, c ph, th?c ?n nhi?u d?u m?, th?c ?n r?t ng?t v th?c ?n c v? chua. ? B? b?a. Bu?n nn c th? n?ng h?n khi ?i. N?u qu v? khng th? dung n?p ???c th?c ?n, ??ng c? ?n. Th? ht ? bo ho?c cc mn ?ng l?nh khc v b? sung calo b? thi?u h?t sau. ? N?m trong vng 2 gi? sau khi ?n. ? Ti?p xc v?i cc y?u t? kch thch trong mi tr??ng. ?i?u ny bao g?m mi th?c ?n, phng c khi,  khng gian kn, phng c mi m?nh, nh?ng n?i nng ho?c ?m, phng qu ?ng ?c v ?n o v phng c ?n chuy?n ??ng ho?c nh?p nhy. C? g?ng ?n ? n?i thng thong khng c mi m?nh. ? Thay ??i c? ??ng nhanh v ??t ng?t. ? U?ng vin s?t v multivitamin c ch?a s?t. N?u qu v? dng vin s?t k ??n, khng d?ng u?ng thu?c ny tr? khi chuyn gia ch?m [place] s?c kh?e ??ng . ? Chu?n b? ?? ?n. Mi th?c ?n c th? ?nh h??ng ??n c?m gic thm ?n c?a qu v? ho?c gy bu?n nn.  ?? gip gi?m nh? tri?u ch?ng: ? L?ng nghe c? th? qu v?. M?i ng??i khc nhau v c s? thch khc nhau. Tm ra ph??ng php c hi?u qu? nh?t ??i v?i qu v?. ? ?n v u?ng t? t?. ? ?n 5-6 b?a nh? m?i ngy thay v 3 b?a l?n. ?n cc b?a nh? v cc b?a ?n nh? c th? gip qu v? trnh b? ?i. ? Vo bu?i sng, tr??c khi ra kh?i gi??ng, hy ?n vi ci bnh quy gin ?? trnh ?i l?i v?i d? dy tr?ng r?ng. ? C? g?ng ?n cc th?c ?n tinh b?t v nh?ng th?c ?n ny th??ng d? dung n?p. V d? bao g?m b?t ng? c?c, bnh m n??ng, bnh m, khoai ty, pasta, c?m v bnh quy xo?n. ? ??a vo t nh?t 1 kh?u ph?n protein cng v?i b?a ?n chnh v b?a ?n nh?. Cc l?a ch?n protein bao g?m  th?t n?c, th?t gia c?m, h?i s?n, cc lo?i ??u, qu? h?ch, b? qu? h?ch, tr?ng, ph mai v s?a chua. ? C? g?ng ?n b?a ?n nh? c nhi?u protein tr??c khi ?i ng?. V d? v? b?a ?n nh? nhi?u protein bao g?m ph mai v bnh quy gin ho?c bnh sandwich k?p b? ??u ph?ng ???c lm b?ng 1 lt bnh m nu v 1 mu?ng c ph (5 g) b? ??u ph?ng. ? ?n ho?c ht nh?ng ?? ?n, ?? u?ng c g?ng. G?ng c th? gip gi?m bu?n nn. Thm  mu?ng c ph g?ng xay vo tr nng ho?c ch?n tr g?ng. ? C? g?ng u?ng n??c p tri cy 100% ho?c n??c ?i?n gi?i. N??c ?i?n gi?i c natri, kali v clo. ? U?ng cc ch?t l?ng l?nh, trong v c ga ho?c v? chua. V d? bao g?m n??c chanh, r??u g?ng, soda chanh-chanh vng, n??c ? v n??c c ga. ? ?nh r?ng ho?c s? d?ng n??c [place] mi?ng sau b?a ?n. ? Trao ??i v?i chuyn gia ch?m [place] s?c kh?e v? vi?c b?t ??u b? sung vitamin B6. H??ng d?n chung  Ch? s? d?ng thu?c khng k ??n v thu?c k ??n theo ch? d?n c?a chuyn gia ch?m [place] s?c kh?e.  Tun th? ch? d?n c?a chuyn gia ch?m [place] s?c kh?e v? cc h?n ch? ?n ho?c u?ng.  Ti?p t?c dng vitamin tr??c khi sinh theo ch? d?n c?a chuyn gia ch?m [place] s?c kh?e. N?u qu v? g?p kh kh?n v?i vi?c u?ng vitamin tr??c khi sinh, hy trao ??i v?i chuyn gia ch?m [place] s?c kh?e v? cc l?a ch?n khc.  Tun th? t?t c? cc l?n khm theo di v tr??c khi sinh (ti?n s?n) theo ch? d?n c?a chuyn gia ch?m [place] s?c kh?e. ?i?u ny c vai tr quan tr?ng. Hy lin l?c v?i chuyn gia ch?m [place] s?c kh?e n?u:  Qu v? b? ?au ? b?ng.  Qu v? b? ?au ??u d?  d?i.  Qu v? c v?n ?? v? th? l?c.  Qu v? ?ang b? s?t cn.  Qu v? c?m th?y chng m?t ho?c y?u. Yu c?u tr? gip ngay l?p t?c n?u:  Qu v? khng th? u?ng n??c m khng b? nn.  Qu v? nn ra mu.  Qu v? b? bu?n nn v nn m?a lin t?c.  Qu v? r?t y?u.  Qu v? b? ng?t.  Qu v? b? s?t v cc tri?u ch?ng c?a qu v? ??t nhin tr?m tr?ng h?n. Tm t?t  Ch?ng nn nhi?u do nghn l m?t d?ng bu?n nn v nn n?ng x?y ra trong  th?i k? mang [place].  Th?c hi?n m?t s? thay ??i trong thi quen ?n u?ng c th? gip gi?m bu?n nn v nn m?a.  Tnh tr?ng ny c th? ???c x? tr b?ng thu?c.  N?u thu?c khng gip gi?m bu?n nn v nn, qu v? c th? c?n ???c truy?n d?ch qua m?t ???ng truy?n t?nh m?ch t?i b?nh vi?n. Thng tin ny khng nh?m m?c ?ch thay th? cho l?i khuyn m chuyn gia ch?m [place] s?c kh?e ni v?i qu v?. Hy b?o ??m qu v? ph?i th?o lu?n b?t k? v?n ?? g m qu v? c v?i chuyn gia ch?m [place] s?c kh?e c?a qu v?. Document Revised: 03/26/2017 Document Reviewed: 09/17/2012 Elsevier Patient Education  2020 [employer].

## 2019-04-07 NOTE — MAU Provider Note (Addendum)
Patient Amanda Fischer 29 y.o. G3P2002  at. [redacted]w[redacted]d here with complaints with abdominal pain that started yesterday, as well as nausea and vomiting. She denies vaginal bleeding, dysuria, diarrhea, fever. She is unsure about her LMP as her periods are irregular.  She endorses some vomiting that started yesterday, but only vomited once today.    History     CSN: [id number]  Arrival date and time: 04/07/19 1253    None     No chief complaint on file.  Abdominal Pain This is a new problem. The current episode started yesterday. The problem occurs intermittently. The problem has been unchanged. The pain is located in the suprapubic region. The pain is at a severity of 5/10. The quality of the pain is cramping. The abdominal pain does not radiate. Associated symptoms include nausea and vomiting. Nothing aggravates the pain. The pain is relieved by nothing.    OB History    Gravida  3   Para  2   Term  2   Preterm      AB      Living  2     SAB      TAB      Ectopic      Multiple  0   Live Births  2           Past Medical History:  Diagnosis Date  . Depression    PPD  . Headache   . Hypotension     Past Surgical History:  Procedure Laterality Date  . TONSILLECTOMY      History reviewed. No pertinent family history.  Social History   Tobacco Use  . Smoking status: Never Smoker  . Smokeless tobacco: Never Used  Substance Use Topics  . Alcohol use: No  . Drug use: No    Allergies: No Known Allergies  Medications Prior to Admission  Medication Sig Dispense Refill Last Dose  . naproxen (NAPROSYN) 500 MG tablet Take 1 tablet (500 mg total) by mouth 2 (two) times daily with a meal. 30 tablet 0   . ondansetron (ZOFRAN) 4 MG tablet Take 1 tablet (4 mg total) by mouth every 8 (eight) hours as needed for nausea or vomiting. 20 tablet 0     Review of Systems  Constitutional: Negative.   Respiratory: Negative.   Gastrointestinal: Positive for abdominal pain,  nausea and vomiting.  Genitourinary: Negative.   Musculoskeletal: Negative.   Psychiatric/Behavioral: Negative.    Physical Exam   Blood pressure (!) 111/59, pulse (!) 116, temperature 98.4 F (36.9 C), temperature source Oral, resp. rate 16, weight 42.6 kg, last menstrual period 02/03/2019, SpO2 100 %.  Physical Exam  Constitutional: She appears well-developed.  HENT:  Head: Normocephalic.  Respiratory: Effort normal.  GI: Soft.  Genitourinary:    Genitourinary Comments: NEFG; uterus feels small, cervix is long, closed, thick. No CMT, suprapubic or adnexal tenderness.    Musculoskeletal:        General: Normal range of motion.     Cervical back: Normal range of motion.  Neurological: She is alert.  Skin: Skin is warm.    MAU Course  Procedures  MDM -complete ectopic work-up done; US shows SIUP at 6 weeks 1 day -Patient declined zofran while in MAU; she rested in MAU and states that she is ready to go home.  -CBC, CMP, UA normal -wet prep normal -GC CT pending  Assessment and Plan   1. Intrauterine pregnancy   2. Abdominal pain   -Patient states  she will have medicaid now, I explained that [facility] can care for her with Medicaid.  -Reviewed bleeding precautions and when to return to MAU; RX for Zofran and phenergan given.  -Start prenatal care in about 6 weeks.   Charlesetta Garibaldi Kalep Full 04/07/2019, 1:50 PM

## 2019-04-08 LAB — GC/CHLAMYDIA PROBE AMP (~~LOC~~) NOT AT ARMC
Chlamydia: NEGATIVE
Comment: NEGATIVE
Comment: NORMAL
Neisseria Gonorrhea: NEGATIVE

## 2019-06-10 LAB — OB RESULTS CONSOLE GC/CHLAMYDIA
Chlamydia: NEGATIVE
Gonorrhea: NEGATIVE

## 2019-06-10 LAB — OB RESULTS CONSOLE HIV ANTIBODY (ROUTINE TESTING): HIV: NONREACTIVE

## 2019-06-10 LAB — OB RESULTS CONSOLE RPR: RPR: NONREACTIVE

## 2019-06-10 LAB — OB RESULTS CONSOLE RUBELLA ANTIBODY, IGM: Rubella: IMMUNE

## 2019-06-10 LAB — OB RESULTS CONSOLE HEPATITIS B SURFACE ANTIGEN: Hepatitis B Surface Ag: NEGATIVE

## 2019-06-10 LAB — OB RESULTS CONSOLE ANTIBODY SCREEN: Antibody Screen: NEGATIVE

## 2019-11-04 LAB — OB RESULTS CONSOLE GBS: GBS: NEGATIVE

## 2019-11-12 ENCOUNTER — Telehealth (HOSPITAL_COMMUNITY): Payer: Self-pay | Admitting: *Deleted

## 2019-11-12 ENCOUNTER — Inpatient Hospital Stay (HOSPITAL_COMMUNITY)
Admission: AD | Admit: 2019-11-12 | Payer: Medicaid Other | Source: Home / Self Care | Admitting: Obstetrics & Gynecology

## 2019-11-12 NOTE — Telephone Encounter (Signed)
Preadmission screen  

## 2019-11-13 NOTE — Telephone Encounter (Signed)
Interpreter number [phone]

## 2019-11-16 ENCOUNTER — Other Ambulatory Visit (HOSPITAL_COMMUNITY): Admission: RE | Admit: 2019-11-16 | Payer: Medicaid Other | Source: Ambulatory Visit

## 2019-11-18 ENCOUNTER — Inpatient Hospital Stay (HOSPITAL_COMMUNITY)
Admission: RE | Admit: 2019-11-18 | Discharge: 2019-11-18 | Disposition: A | Discharge status: Home / Self Care | Payer: Medicaid Other | Source: Ambulatory Visit | Attending: Obstetrics & Gynecology | Admitting: Obstetrics & Gynecology

## 2019-12-02 ENCOUNTER — Other Ambulatory Visit: Payer: Self-pay | Admitting: Obstetrics and Gynecology

## 2019-12-02 ENCOUNTER — Telehealth (HOSPITAL_COMMUNITY): Payer: Self-pay | Admitting: *Deleted

## 2019-12-02 NOTE — Telephone Encounter (Signed)
Preadmission screen Interpreter number [phone]

## 2019-12-03 ENCOUNTER — Other Ambulatory Visit (HOSPITAL_COMMUNITY): Payer: Medicaid Other | Attending: Obstetrics and Gynecology

## 2019-12-04 ENCOUNTER — Inpatient Hospital Stay (HOSPITAL_COMMUNITY)
Admission: AD | Admit: 2019-12-04 | Discharge: 2019-12-06 | DRG: 807 | Disposition: A | Discharge status: Home / Self Care | Payer: Medicaid Other | Attending: Obstetrics and Gynecology | Admitting: Obstetrics and Gynecology

## 2019-12-04 ENCOUNTER — Inpatient Hospital Stay (HOSPITAL_COMMUNITY)
Admission: AD | Admit: 2019-12-04 | Payer: Medicaid Other | Source: Home / Self Care | Admitting: Obstetrics and Gynecology

## 2019-12-04 ENCOUNTER — Inpatient Hospital Stay (HOSPITAL_COMMUNITY): Payer: Medicaid Other | Attending: Obstetrics and Gynecology

## 2019-12-04 ENCOUNTER — Encounter (HOSPITAL_COMMUNITY): Payer: Self-pay | Admitting: Obstetrics and Gynecology

## 2019-12-04 ENCOUNTER — Other Ambulatory Visit: Payer: Self-pay

## 2019-12-04 DIAGNOSIS — Z3A40 40 weeks gestation of pregnancy: Secondary | ICD-10-CM | POA: Diagnosis not present

## 2019-12-04 DIAGNOSIS — O480 Post-term pregnancy: Secondary | ICD-10-CM | POA: Diagnosis present

## 2019-12-04 DIAGNOSIS — Z20822 Contact with and (suspected) exposure to covid-19: Secondary | ICD-10-CM | POA: Diagnosis present

## 2019-12-04 DIAGNOSIS — O26893 Other specified pregnancy related conditions, third trimester: Secondary | ICD-10-CM | POA: Diagnosis present

## 2019-12-04 LAB — RESPIRATORY PANEL BY RT PCR (FLU A&B, COVID)
Influenza A by PCR: NEGATIVE
Influenza B by PCR: NEGATIVE
SARS Coronavirus 2 by RT PCR: NEGATIVE

## 2019-12-04 LAB — CBC
HCT: 38.8 % (ref 36.0–46.0)
Hemoglobin: 10.8 g/dL — ABNORMAL LOW (ref 12.0–15.0)
MCH: 21.2 pg — ABNORMAL LOW (ref 26.0–34.0)
MCHC: 27.8 g/dL — ABNORMAL LOW (ref 30.0–36.0)
MCV: 76.2 fL — ABNORMAL LOW (ref 80.0–100.0)
Platelets: 340 10*3/uL (ref 150–400)
RBC: 5.09 MIL/uL (ref 3.87–5.11)
RDW: 20.4 % — ABNORMAL HIGH (ref 11.5–15.5)
WBC: 12.7 10*3/uL — ABNORMAL HIGH (ref 4.0–10.5)
nRBC: 0.4 % — ABNORMAL HIGH (ref 0.0–0.2)

## 2019-12-04 LAB — TYPE AND SCREEN
ABO/RH(D): O POS
Antibody Screen: NEGATIVE

## 2019-12-04 MED ORDER — ONDANSETRON HCL 4 MG/2ML IJ SOLN: 4.0000 mg | INTRAMUSCULAR | Status: DC | PRN

## 2019-12-04 MED ORDER — SOD CITRATE-CITRIC ACID 500-334 MG/5ML PO SOLN: 30.0000 mL | ORAL | Status: DC | PRN

## 2019-12-04 MED ORDER — IBUPROFEN 600 MG PO TABS
600.0000 mg | ORAL_TABLET | Freq: Four times a day (QID) | ORAL | Status: DC
  Administered 2019-12-04 – 2019-12-06 (×6): 600 mg via ORAL
  Filled 2019-12-04 (×6): qty 1

## 2019-12-04 MED ORDER — ZOLPIDEM TARTRATE 5 MG PO TABS: 5.0000 mg | ORAL_TABLET | Freq: Every evening | ORAL | Status: DC | PRN

## 2019-12-04 MED ORDER — BENZOCAINE-MENTHOL 20-0.5 % EX AERO
1.0000 "application " | INHALATION_SPRAY | CUTANEOUS | Status: DC | PRN
  Administered 2019-12-04: 1 via TOPICAL
  Filled 2019-12-04: qty 56

## 2019-12-04 MED ORDER — PRENATAL MULTIVITAMIN CH
1.0000 | ORAL_TABLET | Freq: Every day | ORAL | Status: DC
  Administered 2019-12-05 – 2019-12-06 (×2): 1 via ORAL
  Filled 2019-12-04 (×2): qty 1

## 2019-12-04 MED ORDER — LACTATED RINGERS IV BOLUS
500.0000 mL | Freq: Once | INTRAVENOUS | Status: AC
  Administered 2019-12-04: 500 mL via INTRAVENOUS

## 2019-12-04 MED ORDER — ONDANSETRON HCL 4 MG/2ML IJ SOLN: 4.0000 mg | Freq: Four times a day (QID) | INTRAMUSCULAR | Status: DC | PRN

## 2019-12-04 MED ORDER — OXYCODONE HCL 5 MG PO TABS: 10.0000 mg | ORAL_TABLET | ORAL | Status: DC | PRN

## 2019-12-04 MED ORDER — OXYCODONE-ACETAMINOPHEN 5-325 MG PO TABS: 1.0000 | ORAL_TABLET | ORAL | Status: DC | PRN

## 2019-12-04 MED ORDER — OXYCODONE-ACETAMINOPHEN 5-325 MG PO TABS: 2.0000 | ORAL_TABLET | ORAL | Status: DC | PRN

## 2019-12-04 MED ORDER — ACETAMINOPHEN 325 MG PO TABS: 650.0000 mg | ORAL_TABLET | ORAL | Status: DC | PRN

## 2019-12-04 MED ORDER — SIMETHICONE 80 MG PO CHEW: 80.0000 mg | CHEWABLE_TABLET | ORAL | Status: DC | PRN

## 2019-12-04 MED ORDER — METHYLERGONOVINE MALEATE 0.2 MG PO TABS: 0.2000 mg | ORAL_TABLET | ORAL | Status: DC | PRN

## 2019-12-04 MED ORDER — LACTATED RINGERS IV SOLN: INTRAVENOUS | Status: DC

## 2019-12-04 MED ORDER — COCONUT OIL OIL: 1.0000 "application " | TOPICAL_OIL | Status: DC | PRN

## 2019-12-04 MED ORDER — TETANUS-DIPHTH-ACELL PERTUSSIS 5-2.5-18.5 LF-MCG/0.5 IM SUSP: 0.5000 mL | Freq: Once | INTRAMUSCULAR | Status: DC

## 2019-12-04 MED ORDER — OXYTOCIN BOLUS FROM INFUSION
333.0000 mL | Freq: Once | INTRAVENOUS | Status: AC
  Administered 2019-12-04: 333 mL via INTRAVENOUS

## 2019-12-04 MED ORDER — OXYTOCIN-SODIUM CHLORIDE 30-0.9 UT/500ML-% IV SOLN
1.0000 m[IU]/min | INTRAVENOUS | Status: DC
  Filled 2019-12-04: qty 500

## 2019-12-04 MED ORDER — SENNOSIDES-DOCUSATE SODIUM 8.6-50 MG PO TABS
2.0000 | ORAL_TABLET | ORAL | Status: DC
  Administered 2019-12-05: 2 via ORAL
  Filled 2019-12-04: qty 2

## 2019-12-04 MED ORDER — OXYTOCIN-SODIUM CHLORIDE 30-0.9 UT/500ML-% IV SOLN
2.5000 [IU]/h | INTRAVENOUS | Status: DC
  Administered 2019-12-04: 2.5 [IU]/h via INTRAVENOUS

## 2019-12-04 MED ORDER — WITCH HAZEL-GLYCERIN EX PADS: 1.0000 "application " | MEDICATED_PAD | CUTANEOUS | Status: DC | PRN

## 2019-12-04 MED ORDER — DIBUCAINE (PERIANAL) 1 % EX OINT: 1.0000 "application " | TOPICAL_OINTMENT | CUTANEOUS | Status: DC | PRN

## 2019-12-04 MED ORDER — LIDOCAINE HCL (PF) 1 % IJ SOLN
30.0000 mL | INTRAMUSCULAR | Status: AC | PRN
  Administered 2019-12-04: 30 mL via SUBCUTANEOUS
  Filled 2019-12-04: qty 30

## 2019-12-04 MED ORDER — TERBUTALINE SULFATE 1 MG/ML IJ SOLN: 0.2500 mg | Freq: Once | INTRAMUSCULAR | Status: DC | PRN

## 2019-12-04 MED ORDER — LACTATED RINGERS IV SOLN: 500.0000 mL | INTRAVENOUS | Status: DC | PRN

## 2019-12-04 MED ORDER — DIPHENHYDRAMINE HCL 25 MG PO CAPS: 25.0000 mg | ORAL_CAPSULE | Freq: Four times a day (QID) | ORAL | Status: DC | PRN

## 2019-12-04 MED ORDER — OXYCODONE HCL 5 MG PO TABS: 5.0000 mg | ORAL_TABLET | ORAL | Status: DC | PRN

## 2019-12-04 MED ORDER — ONDANSETRON HCL 4 MG PO TABS: 4.0000 mg | ORAL_TABLET | ORAL | Status: DC | PRN

## 2019-12-04 MED ORDER — METHYLERGONOVINE MALEATE 0.2 MG/ML IJ SOLN: 0.2000 mg | INTRAMUSCULAR | Status: DC | PRN

## 2019-12-04 MED ORDER — BUTORPHANOL TARTRATE 1 MG/ML IJ SOLN: 1.0000 mg | INTRAMUSCULAR | Status: DC | PRN

## 2019-12-04 NOTE — Progress Notes (Signed)
Notified Dr. Tenny Craw regarding low BP. Dr.Ross stated since patient is not symptomatic to currently monitor with no further treatment.    12/04/19 1938  Vital Signs  BP (!) 85/51  BP Location Left Arm  Patient Position (if appropriate) Semi-fowlers  BP Method Automatic  Pulse Rate 91  Pulse Rate Source Monitor  Resp 18  Temp 98.6 F (37 C)  Temp Source Oral  POSS Scale ([place] Opioid Sedation Scale)  POSS *See Group Information* 1-Acceptable,Awake and alert  Oxygen Therapy  SpO2 100 %

## 2019-12-04 NOTE — H&P (Signed)
Amanda Fischer is a 29 y.o. female presenting for labor  29 yo G3P2002 @ 40+4 presents for labor. Her pregnancy has been uncomplicated OB History    Gravida  3   Para  2   Term  2   Preterm      AB      Living  2     SAB      TAB      Ectopic      Multiple  0   Live Births  2          Past Medical History:  Diagnosis Date  . Depression    PPD  . Headache   . Hypotension    Past Surgical History:  Procedure Laterality Date  . TONSILLECTOMY     Family History: family history is not on file. Social History:  reports that she has never smoked. She has never used smokeless tobacco. She reports that she does not drink alcohol and does not use drugs.     Maternal Diabetes: No Genetic Screening: Normal Maternal Ultrasounds/Referrals: Normal and Isolated EIF (echogenic intracardiac focus) Fetal Ultrasounds or other Referrals:  None Maternal Substance Abuse:  No Significant Maternal Medications:  None Significant Maternal Lab Results:  None Other Comments:  None  Review of Systems History Dilation: 7 Effacement (%): 90 Station: 0 Exam by:: Shauna Hugh, RN  Blood pressure 109/76, pulse 91, temperature 98.2 F (36.8 C), temperature source Oral, resp. rate 20, height 5\' 1"  (1.549 m), weight 59 kg, last menstrual period 02/03/2019. Exam Physical Exam  Prenatal labs: ABO, Rh:   Antibody: Negative (04/21 0000) Rubella: Immune (04/21 0000) RPR: Nonreactive (04/21 0000)  HBsAg: Negative (04/21 0000)  HIV: Non-reactive (04/21 0000)  GBS: Negative/-- (09/15 0000)   Assessment/Plan: 1) Admit 2) Epidural on request 3) Anticipate svd   07-21-2004 12/04/2019, 11:21 AM

## 2019-12-04 NOTE — MAU Note (Signed)
Patient presents to MAU with vaginal bleeding and CTX.

## 2019-12-05 LAB — RPR: RPR Ser Ql: NONREACTIVE

## 2019-12-05 NOTE — Progress Notes (Signed)
CSW received consult for hx of Postpartum depression. With the assistance of [employer] Video Interpreter([place] [phone]), CSW met with MOB at bedside  to offer support and complete assessment. On arrival, CSW introduced self and stated reason for visit. MOB was alone in room and breastfeeding female infant. MOB was pleasant and engaged.    MOB confirmed h/o of postpartum depression following second child's birth. MOB stated sx such as insomnia and racing thoughts started one month after that birth and lasted for about one month. MOB stated OB prescribed Rx but she does not remember the name. MOB stated she only took Rx for one week. MOB denies any other BH hx or concerns. MOB denied any SI, HI, or domestic violence. MOB stated her current mood is happy, and identified coping skills as eating good food and listening to music. MOB stated FOB is her support. MOB declines any additional resources at this time, as she states she is doing fine.   CSW provided education regarding the baby blues period vs. perinatal mood disorders, discussed treatment and gave resources for mental health follow up if concerns arise.  CSW recommends self-evaluation during the postpartum time period using the New Mom Checklist from Postpartum Progress and encouraged MOB to contact a medical professional if symptoms are noted at any time. MOB stated understanding and denied any questions. (New Mom Checklist was provided in [place])    CSW provided review of Sudden Infant Death Syndrome (SIDS) precautions. MOB stated understanding and denied any questions. MOB confirmed having all needed items for infant including car seat and crib for infants safe sleep.     CSW identifies no further need for intervention and no barriers to discharge at this time.  Delynn Olvera D. Lissa Morales, MSW, [place] Clinical Social Worker [phone]

## 2019-12-05 NOTE — Progress Notes (Signed)
Post Partum Day 1 Subjective: no complaints, up ad lib, voiding and tolerating PO  Objective: Blood pressure (!) 87/54, pulse 91, temperature 98.5 F (36.9 C), temperature source Oral, resp. rate 18, height 5\' 1"  (1.549 m), weight 59 kg, last menstrual period 02/03/2019, SpO2 97 %, unknown if currently breastfeeding.  Vitals:   12/04/19 2030 12/04/19 2135 12/04/19 2300 12/05/19 0500  BP: (!) 86/59 (!) 96/59 (!) 83/83 (!) 87/54  Pulse: 91 91 91   Resp:   19 18  Temp:   98.4 F (36.9 C) 98.5 F (36.9 C)  TempSrc:    Oral  SpO2:   100% 97%  Weight:      Height:         Physical Exam:  General: alert, cooperative and appears stated age 74: appropriate Uterine Fundus: firm DVT Evaluation: No evidence of DVT seen on physical exam.  Recent Labs    12/04/19 1051  HGB 10.8*  HCT 38.8   Results for orders placed or performed during the hospital encounter of 12/04/19 (from the past 48 hour(s))  Respiratory Panel by RT PCR (Flu A&B, Covid) - Nasopharyngeal Swab     Status: None   Collection Time: 12/04/19 10:34 AM   Specimen: Nasopharyngeal Swab  Result Value Ref Range   SARS Coronavirus 2 by RT PCR NEGATIVE NEGATIVE    Comment: (NOTE) SARS-CoV-2 target nucleic acids are NOT DETECTED.  The SARS-CoV-2 RNA is generally detectable in upper respiratoy specimens during the acute phase of infection. The lowest concentration of SARS-CoV-2 viral copies this assay can detect is 131 copies/mL. A negative result does not preclude SARS-Cov-2 infection and should not be used as the sole basis for treatment or other patient management decisions. A negative result may occur with  improper specimen collection/handling, submission of specimen other than nasopharyngeal swab, presence of viral mutation(s) within the areas targeted by this assay, and inadequate number of viral copies (<131 copies/mL). A negative result must be combined with clinical observations, patient history, and  epidemiological information. The expected result is Negative.  Fact Sheet for Patients:  12/06/19  Fact Sheet for Healthcare Providers:  https://www.moore.com/  This test is no t yet approved or cleared by the https://www.young.biz/ FDA and  has been authorized for detection and/or diagnosis of SARS-CoV-2 by FDA under an Emergency Use Authorization (EUA). This EUA will remain  in effect (meaning this test can be used) for the duration of the COVID-19 declaration under Section 564(b)(1) of the Act, 21 U.S.C. section 360bbb-3(b)(1), unless the authorization is terminated or revoked sooner.     Influenza A by PCR NEGATIVE NEGATIVE   Influenza B by PCR NEGATIVE NEGATIVE    Comment: (NOTE) The Xpert Xpress SARS-CoV-2/FLU/RSV assay is intended as an aid in  the diagnosis of influenza from Nasopharyngeal swab specimens and  should not be used as a sole basis for treatment. Nasal washings and  aspirates are unacceptable for Xpert Xpress SARS-CoV-2/FLU/RSV  testing.  Fact Sheet for Patients: [place]  Fact Sheet for Healthcare Providers: https://www.moore.com/  This test is not yet approved or cleared by the https://www.young.biz/ FDA and  has been authorized for detection and/or diagnosis of SARS-CoV-2 by  FDA under an Emergency Use Authorization (EUA). This EUA will remain  in effect (meaning this test can be used) for the duration of the  Covid-19 declaration under Section 564(b)(1) of the Act, 21  U.S.C. section 360bbb-3(b)(1), unless the authorization is  terminated or revoked. Performed at [facility],  [address] [place], [place] [address]   Type and screen     Status: None   Collection Time: 12/04/19 10:35 AM  Result Value Ref Range   ABO/RH(D) O POS    Antibody Screen NEG    Sample Expiration      12/07/2019,2359 Performed at [facility], [address],  [place], [place] [address]   CBC     Status: Abnormal   Collection Time: 12/04/19 10:51 AM  Result Value Ref Range   WBC 12.7 (H) 4.0 - 10.5 K/uL   RBC 5.09 3.87 - 5.11 MIL/uL   Hemoglobin 10.8 (L) 12.0 - 15.0 g/dL   HCT 06.2 36 - 46 %   MCV 76.2 (L) 80.0 - 100.0 fL   MCH 21.2 (L) 26.0 - 34.0 pg   MCHC 27.8 (L) 30.0 - 36.0 g/dL   RDW 37.6 (H) 28.3 - 15.1 %   Platelets 340 150 - 400 K/uL   nRBC 0.4 (H) 0.0 - 0.2 %    Comment: Performed at [facility], [address] [place], [place] [address]  RPR     Status: None   Collection Time: 12/04/19 10:51 AM  Result Value Ref Range   RPR Ser Ql NON REACTIVE NON REACTIVE    Comment: Performed at [facility], [address], [place], [place] [address]    Assessment/Plan: Plan for discharge tomorrow Pts BP runs low, Hgb stable Routine PP care   LOS: 1 day   Waynard Reeds 12/05/2019, 12:00 PM

## 2019-12-06 MED ORDER — IBUPROFEN 600 MG PO TABS: 600.0000 mg | ORAL_TABLET | Freq: Four times a day (QID) | ORAL | 0 refills | Status: DC | PRN

## 2019-12-06 NOTE — Discharge Summary (Signed)
Postpartum Discharge Summary     Patient Name: Amanda Fischer DOB: 10/22/1990 MRN: [id number]  Date of admission: 12/04/2019 Delivery date:12/04/2019  Delivering provider: Waynard Fischer  Date of discharge: 12/06/2019  Admitting diagnosis: Post term pregnancy over 40 weeks [O48.0] Spontaneous vaginal delivery [O80] Intrauterine pregnancy: [redacted]w[redacted]d     Secondary diagnosis:  Active Problems:   Post term pregnancy over 40 weeks   Spontaneous vaginal delivery     Discharge diagnosis: Term Pregnancy Delivered                                              Post partum procedures:NA Augmentation: AROM Complications: None  Hospital course: Onset of Labor With Vaginal Delivery      29 y.o. yo G3P3003 at [redacted]w[redacted]d was admitted in Active Labor on 12/04/2019. Patient had an uncomplicated labor course as follows:  Membrane Rupture Time/Date: 11:37 AM ,12/04/2019   Delivery Method:Vaginal, Spontaneous  Episiotomy: None  Lacerations:  2nd degree  Patient had an uncomplicated postpartum course.  She is ambulating, tolerating a regular diet, passing flatus, and urinating well. Patient is discharged home in stable condition on 12/06/19.  Newborn Data: Birth date:12/04/2019  Birth time:12:06 PM  Gender:Female  Living status:Living  Apgars:8 ,9  Weight:3065 g   Magnesium Sulfate received: No BMZ received: No Rhophylac:No   Physical exam  Vitals:   12/05/19 1445 12/05/19 1530 12/05/19 2200 12/06/19 0528  BP: (!) 82/55 (!) 81/59 (!) 82/63 94/61  Pulse: 96 94 (!) 104 85  Resp: 18 16 18 18   Temp: 98.6 F (37 C) 98.4 F (36.9 C) 97.9 F (36.6 C) 98.1 F (36.7 C)  TempSrc: Oral Oral Oral Oral  SpO2:  98% 100% 100%  Weight:      Height:       General: alert, cooperative and no distress Lochia: appropriate Uterine Fundus: firm Incision: Healing well with no significant drainage DVT Evaluation: No evidence of DVT seen on physical exam. Labs: Lab Results  Component Value Date   WBC 12.7  (H) 12/04/2019   HGB 10.8 (L) 12/04/2019   HCT 38.8 12/04/2019   MCV 76.2 (L) 12/04/2019   PLT 340 12/04/2019   CMP Latest Ref Rng & Units 04/07/2019  Glucose 70 - 99 mg/dL 87  BUN 6 - 20 mg/dL 5(L)  Creatinine 04/09/2019 - 1.00 mg/dL 4.09  Sodium 8.11 - 914 mmol/L 135  Potassium 3.5 - 5.1 mmol/L 3.4(L)  Chloride 98 - 111 mmol/L 107  CO2 22 - 32 mmol/L 22  Calcium 8.9 - 10.3 mg/dL 9.0  Total Protein 6.5 - 8.1 g/dL 7.0  Total Bilirubin 0.3 - 1.2 mg/dL 0.5  Alkaline Phos 38 - 126 U/L 32(L)  AST 15 - 41 U/L 14(L)  ALT 0 - 44 U/L 9   Edinburgh Score: No flowsheet data found.    After visit meds:  Allergies as of 12/06/2019   No Known Allergies     Medication List    STOP taking these medications   ondansetron 4 MG tablet Commonly known as: Zofran   ondansetron 8 MG disintegrating tablet Commonly known as: Zofran ODT   promethazine 12.5 MG tablet Commonly known as: PHENERGAN     TAKE these medications   ibuprofen 600 MG tablet Commonly known as: ADVIL Take 1 tablet (600 mg total) by mouth every 6 (six) hours as needed.  Discharge home in stable condition Infant Feeding: Bottle and Breast Infant Disposition:home with mother Discharge instruction: per After Visit Summary and Postpartum booklet. Activity: Advance as tolerated. Pelvic rest for 6 weeks.  Diet: routine diet Anticipated Birth Control: Unsure Postpartum Appointment:4 weeks Future Appointments:No future appointments. Follow up Visit:  Follow-up Information    Amanda Reeds, MD Follow up in 4 week(s).   Specialty: Obstetrics and Gynecology Why: For a postpartum eval Contact information: [address] [place] [address] [phone]                   12/06/2019 Amanda Reeds, MD

## 2023-05-17 ENCOUNTER — Emergency Department (HOSPITAL_COMMUNITY): Payer: MEDICAID

## 2023-05-17 ENCOUNTER — Other Ambulatory Visit: Payer: Self-pay

## 2023-05-17 ENCOUNTER — Inpatient Hospital Stay (HOSPITAL_COMMUNITY)
Admission: EM | Admit: 2023-05-17 | Discharge: 2023-05-23 | DRG: 831 | Disposition: A | Discharge status: Home / Self Care | Payer: MEDICAID | Attending: Pulmonary Disease | Admitting: Pulmonary Disease

## 2023-05-17 ENCOUNTER — Encounter (HOSPITAL_COMMUNITY): Payer: Self-pay | Admitting: Emergency Medicine

## 2023-05-17 DIAGNOSIS — O219 Vomiting of pregnancy, unspecified: Secondary | ICD-10-CM | POA: Diagnosis present

## 2023-05-17 DIAGNOSIS — E876 Hypokalemia: Secondary | ICD-10-CM | POA: Diagnosis present

## 2023-05-17 DIAGNOSIS — E860 Dehydration: Secondary | ICD-10-CM | POA: Diagnosis present

## 2023-05-17 DIAGNOSIS — E8809 Other disorders of plasma-protein metabolism, not elsewhere classified: Secondary | ICD-10-CM | POA: Diagnosis present

## 2023-05-17 DIAGNOSIS — O99611 Diseases of the digestive system complicating pregnancy, first trimester: Secondary | ICD-10-CM | POA: Diagnosis present

## 2023-05-17 DIAGNOSIS — K37 Unspecified appendicitis: Secondary | ICD-10-CM | POA: Diagnosis present

## 2023-05-17 DIAGNOSIS — K6389 Other specified diseases of intestine: Secondary | ICD-10-CM | POA: Diagnosis present

## 2023-05-17 DIAGNOSIS — O99281 Endocrine, nutritional and metabolic diseases complicating pregnancy, first trimester: Secondary | ICD-10-CM | POA: Diagnosis present

## 2023-05-17 DIAGNOSIS — A419 Sepsis, unspecified organism: Secondary | ICD-10-CM | POA: Diagnosis present

## 2023-05-17 DIAGNOSIS — F419 Anxiety disorder, unspecified: Secondary | ICD-10-CM | POA: Diagnosis present

## 2023-05-17 DIAGNOSIS — O99341 Other mental disorders complicating pregnancy, first trimester: Secondary | ICD-10-CM | POA: Diagnosis present

## 2023-05-17 DIAGNOSIS — I959 Hypotension, unspecified: Principal | ICD-10-CM | POA: Diagnosis present

## 2023-05-17 DIAGNOSIS — Z5986 Financial insecurity: Secondary | ICD-10-CM

## 2023-05-17 DIAGNOSIS — E861 Hypovolemia: Secondary | ICD-10-CM | POA: Diagnosis present

## 2023-05-17 DIAGNOSIS — O99011 Anemia complicating pregnancy, first trimester: Secondary | ICD-10-CM | POA: Diagnosis present

## 2023-05-17 DIAGNOSIS — O98811 Other maternal infectious and parasitic diseases complicating pregnancy, first trimester: Principal | ICD-10-CM | POA: Diagnosis present

## 2023-05-17 DIAGNOSIS — Q438 Other specified congenital malformations of intestine: Secondary | ICD-10-CM

## 2023-05-17 DIAGNOSIS — Z23 Encounter for immunization: Secondary | ICD-10-CM

## 2023-05-17 DIAGNOSIS — R571 Hypovolemic shock: Secondary | ICD-10-CM | POA: Diagnosis present

## 2023-05-17 DIAGNOSIS — Z3A12 12 weeks gestation of pregnancy: Secondary | ICD-10-CM

## 2023-05-17 DIAGNOSIS — E872 Acidosis, unspecified: Secondary | ICD-10-CM | POA: Diagnosis present

## 2023-05-17 LAB — I-STAT CG4 LACTIC ACID, ED
Lactic Acid, Venous: 2.8 mmol/L (ref 0.5–1.9)
Lactic Acid, Venous: 2.2 mmol/L (ref 0.5–1.9)

## 2023-05-17 LAB — URINALYSIS, W/ REFLEX TO CULTURE (INFECTION SUSPECTED)
Bacteria, UA: NONE SEEN
Bilirubin Urine: NEGATIVE
Glucose, UA: NEGATIVE mg/dL
Hgb urine dipstick: NEGATIVE
Ketones, ur: 20 mg/dL — AB
Leukocytes,Ua: NEGATIVE
Nitrite: NEGATIVE
Protein, ur: NEGATIVE mg/dL
Specific Gravity, Urine: 1.01 (ref 1.005–1.030)
pH: 7 (ref 5.0–8.0)

## 2023-05-17 LAB — COMPREHENSIVE METABOLIC PANEL WITH GFR
ALT: 18 U/L (ref 0–44)
AST: 19 U/L (ref 15–41)
Albumin: 2.9 g/dL — ABNORMAL LOW (ref 3.5–5.0)
Alkaline Phosphatase: 29 U/L — ABNORMAL LOW (ref 38–126)
Anion gap: 11 (ref 5–15)
BUN: 10 mg/dL (ref 6–20)
CO2: 20 mmol/L — ABNORMAL LOW (ref 22–32)
Calcium: 8.3 mg/dL — ABNORMAL LOW (ref 8.9–10.3)
Chloride: 103 mmol/L (ref 98–111)
Creatinine, Ser: 0.68 mg/dL (ref 0.44–1.00)
GFR, Estimated: >60 mL/min (ref >60)
Glucose, Bld: 82 mg/dL (ref 70–99)
Potassium: 3.4 mmol/L — ABNORMAL LOW (ref 3.5–5.1)
Sodium: 134 mmol/L — ABNORMAL LOW (ref 135–145)
Total Bilirubin: 0.4 mg/dL (ref 0.0–1.2)
Total Protein: 6.2 g/dL — ABNORMAL LOW (ref 6.5–8.1)

## 2023-05-17 LAB — RESP PANEL BY RT-PCR (RSV, FLU A&B, COVID)  RVPGX2
Influenza A by PCR: NEGATIVE
Influenza B by PCR: NEGATIVE
Resp Syncytial Virus by PCR: NEGATIVE
SARS Coronavirus 2 by RT PCR: NEGATIVE

## 2023-05-17 LAB — CBC
HCT: 41.4 % (ref 36.0–46.0)
Hemoglobin: 13.8 g/dL (ref 12.0–15.0)
MCH: 26.9 pg (ref 26.0–34.0)
MCHC: 33.3 g/dL (ref 30.0–36.0)
MCV: 80.7 fL (ref 80.0–100.0)
Platelets: 341 10*3/uL (ref 150–400)
RBC: 5.13 MIL/uL — ABNORMAL HIGH (ref 3.87–5.11)
RDW: 13.2 % (ref 11.5–15.5)
WBC: 11.4 10*3/uL — ABNORMAL HIGH (ref 4.0–10.5)
nRBC: 0 % (ref 0.0–0.2)

## 2023-05-17 LAB — PROTIME-INR
INR: 1 (ref 0.8–1.2)
Prothrombin Time: 13.1 s (ref 11.4–15.2)

## 2023-05-17 LAB — LIPASE, BLOOD: Lipase: 53 U/L — ABNORMAL HIGH (ref 11–51)

## 2023-05-17 LAB — HCG, QUANTITATIVE, PREGNANCY: hCG, Beta Chain, Quant, S: 112065 m[IU]/mL — ABNORMAL HIGH (ref <5)

## 2023-05-17 MED ORDER — ONDANSETRON HCL 4 MG/2ML IJ SOLN
4.0000 mg | Freq: Once | INTRAMUSCULAR | Status: AC
  Administered 2023-05-17: 4 mg via INTRAVENOUS
  Filled 2023-05-17: qty 2

## 2023-05-17 MED ORDER — SODIUM CHLORIDE 0.9 % IV BOLUS
1000.0000 mL | Freq: Once | INTRAVENOUS | Status: AC
  Administered 2023-05-17: 1000 mL via INTRAVENOUS

## 2023-05-17 MED ORDER — IOHEXOL 350 MG/ML SOLN
75.0000 mL | Freq: Once | INTRAVENOUS | Status: AC | PRN
  Administered 2023-05-17: 75 mL via INTRAVENOUS

## 2023-05-17 MED ORDER — NOREPINEPHRINE 4 MG/250ML-% IV SOLN
0.0000 ug/min | INTRAVENOUS | Status: DC
  Administered 2023-05-17: 2 ug/min via INTRAVENOUS
  Administered 2023-05-18: 6 ug/min via INTRAVENOUS
  Administered 2023-05-19: 3 ug/min via INTRAVENOUS
  Administered 2023-05-20: 4 ug/min via INTRAVENOUS
  Administered 2023-05-20: 6 ug/min via INTRAVENOUS
  Administered 2023-05-21: 1 ug/min via INTRAVENOUS
  Filled 2023-05-17 (×6): qty 250

## 2023-05-17 MED ORDER — LACTATED RINGERS IV SOLN
INTRAVENOUS | Status: AC
  Administered 2023-05-17: 150 mL/h via INTRAVENOUS

## 2023-05-17 MED ORDER — LACTATED RINGERS IV BOLUS
1000.0000 mL | Freq: Once | INTRAVENOUS | Status: AC
  Administered 2023-05-17: 1000 mL via INTRAVENOUS

## 2023-05-17 MED ORDER — SODIUM CHLORIDE 0.9 % IV SOLN
3.0000 g | Freq: Once | INTRAVENOUS | Status: AC
  Administered 2023-05-17: 3 g via INTRAVENOUS
  Filled 2023-05-17: qty 8

## 2023-05-17 MED ORDER — FENTANYL CITRATE PF 50 MCG/ML IJ SOSY
50.0000 ug | PREFILLED_SYRINGE | Freq: Once | INTRAMUSCULAR | Status: AC
  Administered 2023-05-17: 50 ug via INTRAVENOUS
  Filled 2023-05-17: qty 1

## 2023-05-17 MED ORDER — SODIUM CHLORIDE 0.9 % IV SOLN: 250.0000 mL | INTRAVENOUS | Status: AC

## 2023-05-17 NOTE — ED Notes (Signed)
 Ultrasound at bedside

## 2023-05-17 NOTE — Progress Notes (Signed)
 Pt being followed by [facility] for Sepsis protocol.

## 2023-05-17 NOTE — ED Triage Notes (Addendum)
 Pt presents with severe abd pain that started a month ago with associated lack of appetite, N/V, and constipation and subjective fever. Pt saw UC last week and was referred to the ED. Pt unable to count the number of times she has vomited in last 24 hours. Pt reports red blood with emesis. Pt also has had 3 syncopal episodes in the last 24 hours. Pt's last BM 2 days ago. Pt is also 2 months pregnant, but has not seen OB/GYN yet.

## 2023-05-17 NOTE — ED Notes (Signed)
 Patient transported to CT

## 2023-05-17 NOTE — ED Provider Notes (Signed)
 [place] EMERGENCY DEPARTMENT AT [facility] Provider Note   CSN: [id number] Arrival date & time: 05/17/23  1833     History  Chief Complaint  Patient presents with   Abdominal Pain    Amanda Fischer is a 33 y.o. female with abdominal pain. LMP " early February" per husband. There is a language barrier and husband provides translation due to acuity of patient condition.  He has had positive pregnancy test at home.  Husband states that she has been suffering with significant nausea and vomiting had very poor oral intake recently.  She began having abdominal pain about 1 month ago.  Has been intermittent but now with constant and progressively worsening.  She was seen at an outpatient urgent care 1 week ago was told she needed to come to the ER but did not she was given medications for nausea.  Patient now having severe abdominal pain.  He does report that she has been having constipation as well.  No fevers.    Abdominal Pain      Home Medications Prior to Admission medications   Medication Sig Start Date End Date Taking? Authorizing Provider  ibuprofen (ADVIL) 600 MG tablet Take 1 tablet (600 mg total) by mouth every 6 (six) hours as needed. 12/06/19   Waynard Reeds, MD      Allergies    Patient has no known allergies.    Review of Systems   Review of Systems  Gastrointestinal:  Positive for abdominal pain.    Physical Exam Updated Vital Signs BP (!) 82/60 (BP Location: Left Arm)   Pulse 93   Temp (!) 97.2 F (36.2 C) (Axillary)   Ht 5\' 1"  (1.549 m)   Wt 49.9 kg   SpO2 100%   BMI 20.78 kg/m  Physical Exam Vitals and nursing note reviewed.  Constitutional:      General: She is not in acute distress.    Appearance: She is well-developed. She is not diaphoretic.     Comments: Appears extremely uncomfortable   HENT:     Head: Normocephalic and atraumatic.     Right Ear: External ear normal.     Left Ear: External ear normal.     Nose: Nose normal.      Mouth/Throat:     Mouth: Mucous membranes are moist.  Eyes:     General: No scleral icterus.    Conjunctiva/sclera: Conjunctivae normal.  Cardiovascular:     Rate and Rhythm: Normal rate and regular rhythm.     Heart sounds: Normal heart sounds. No murmur heard.    No friction rub. No gallop.  Pulmonary:     Effort: Pulmonary effort is normal. No respiratory distress.     Breath sounds: Normal breath sounds.  Abdominal:     General: Bowel sounds are normal. There is no distension.     Palpations: Abdomen is soft. There is no mass.     Tenderness: There is abdominal tenderness in the right lower quadrant. There is no guarding.  Musculoskeletal:     Cervical back: Normal range of motion.  Skin:    General: Skin is warm and dry.  Neurological:     Mental Status: She is alert and oriented to person, place, and time.  Psychiatric:        Behavior: Behavior normal.     ED Results / Procedures / Treatments   Labs (all labs ordered are listed, but only abnormal results are displayed) Labs Reviewed  CULTURE, BLOOD (ROUTINE X  2)  CULTURE, BLOOD (ROUTINE X 2)  LIPASE, BLOOD  COMPREHENSIVE METABOLIC PANEL WITH GFR  CBC  HCG, QUANTITATIVE, PREGNANCY  PROTIME-INR  URINALYSIS, W/ REFLEX TO CULTURE (INFECTION SUSPECTED)  I-STAT CG4 LACTIC ACID, ED    EKG None  Radiology No results found.  Procedures .Critical Care  Performed by: Arthor Captain, PA-C Authorized by: Arthor Captain, PA-C   Critical care provider statement:    Critical care time (minutes):  65   Critical care time was exclusive of:  Separately billable procedures and treating other patients   Critical care was necessary to treat or prevent imminent or life-threatening deterioration of the following conditions:  Sepsis   Critical care was time spent personally by me on the following activities:  Development of treatment plan with patient or surrogate, discussions with consultants, evaluation of patient's  response to treatment, examination of patient, ordering and review of laboratory studies, ordering and review of radiographic studies, ordering and performing treatments and interventions, pulse oximetry, re-evaluation of patient's condition, review of old charts, obtaining history from patient or surrogate and interpretation of cardiac output measurements     Medications Ordered in ED Medications - No data to display  ED Course/ Medical Decision Making/ A&P Clinical Course as of 05/17/23 2211  Fri May 17, 2023  1906 EMERGENCY DEPARTMENT [place] PREGNANCY "Study: Limited Ultrasound of the Pelvis for Pregnancy"  INDICATIONS:Pregnancy(required) and Abdominal or pelvic pain Multiple views of the uterus and pelvic cavity were obtained in real-time with a multi-frequency probe.  APPROACH:Transabdominal  PERFORMED BY: Myself IMAGES ARCHIVED?: Yes LIMITATIONS: Emergent procedure and pain PREGNANCY FREE FLUID: None ADNEXAL FINDINGS:NA GESTATIONAL AGE, ESTIMATE: NA FETAL HEART RATE: 162 INTERPRETATION: Fetal heart activity seen and confirmed IUP     [CC]  2128 Lactic Acid, Venous(!!): 2.2 Lactic acid improving  [AH]  2210 Patient states she is feeling better however she still looks very puny.  She has still has an increased respiratory rate.  Maps are about 67 despite more than 30 mL/kg fluid resuscitation.  She has had some improvement in her lactic acid.  I discussed risk versus benefits of CT scan to rule out something like ruptured appendicitis.  After going over all risks and benefits the patient states that she is in agreement to get the CT scan done. [AH]    Clinical Course User Index [AH] Arthor Captain, PA-C [CC] Glyn Ade, MD                                 Medical Decision Making Amount and/or Complexity of Data Reviewed Labs: ordered. Decision-making details documented in ED Course. Radiology: ordered.  Risk Prescription drug management.   33 year old female who  presents emergency department with abdominal pain, pregnancy, vomiting.  Differential diagnosis includes ectopic pregnancy, urinary tract infection, ruptured ovarian cyst, torsion, diverticulitis, kidney stone.  Patient is meeting sepsis criteria with very soft blood pressures.  She is at maintaining normal heart rate.  She is afebrile but has an elevated white blood cell count and elevated respiratory rate.  She appears unwell.  She has received greater than 30 mL/kg fluid resuscitation and continues to have very soft pressures with MAP just above 60.  She is tender to palpation in the pelvis.  She has had no instrumentation congestive of potential for endometritis.  She is predominantly tender in the right lower quadrant.  She is agreed to CT scan.  She has received broad-spectrum antibiotics  with Unasyn for coverage of intra-abdominal infection.  She currently states that her pain and nausea are under control.  She is actually feeling a little bit hungry at this time.  Patient case currently pending.  Given to Doctor Almeta Monas, Doran Durand at shift handoff.        Final Clinical Impression(s) / ED Diagnoses Final diagnoses:  None    Rx / DC Orders ED Discharge Orders     None         Arthor Captain, PA-C 05/17/23 2214    Glyn Ade, MD 05/20/23 [phone]

## 2023-05-17 NOTE — ED Notes (Signed)
 ED Provider at bedside.

## 2023-05-18 DIAGNOSIS — E8809 Other disorders of plasma-protein metabolism, not elsewhere classified: Secondary | ICD-10-CM | POA: Diagnosis present

## 2023-05-18 DIAGNOSIS — O99281 Endocrine, nutritional and metabolic diseases complicating pregnancy, first trimester: Secondary | ICD-10-CM | POA: Diagnosis present

## 2023-05-18 DIAGNOSIS — E46 Unspecified protein-calorie malnutrition: Secondary | ICD-10-CM

## 2023-05-18 DIAGNOSIS — O98811 Other maternal infectious and parasitic diseases complicating pregnancy, first trimester: Secondary | ICD-10-CM | POA: Diagnosis present

## 2023-05-18 DIAGNOSIS — Z5986 Financial insecurity: Secondary | ICD-10-CM | POA: Diagnosis not present

## 2023-05-18 DIAGNOSIS — F419 Anxiety disorder, unspecified: Secondary | ICD-10-CM | POA: Diagnosis present

## 2023-05-18 DIAGNOSIS — O99341 Other mental disorders complicating pregnancy, first trimester: Secondary | ICD-10-CM | POA: Diagnosis present

## 2023-05-18 DIAGNOSIS — K6389 Other specified diseases of intestine: Secondary | ICD-10-CM | POA: Diagnosis present

## 2023-05-18 DIAGNOSIS — Z3A12 12 weeks gestation of pregnancy: Secondary | ICD-10-CM | POA: Diagnosis not present

## 2023-05-18 DIAGNOSIS — O99611 Diseases of the digestive system complicating pregnancy, first trimester: Secondary | ICD-10-CM | POA: Diagnosis present

## 2023-05-18 DIAGNOSIS — A419 Sepsis, unspecified organism: Secondary | ICD-10-CM | POA: Diagnosis present

## 2023-05-18 DIAGNOSIS — O99011 Anemia complicating pregnancy, first trimester: Secondary | ICD-10-CM | POA: Diagnosis present

## 2023-05-18 DIAGNOSIS — O219 Vomiting of pregnancy, unspecified: Secondary | ICD-10-CM | POA: Diagnosis present

## 2023-05-18 DIAGNOSIS — E860 Dehydration: Secondary | ICD-10-CM | POA: Diagnosis present

## 2023-05-18 DIAGNOSIS — Q438 Other specified congenital malformations of intestine: Secondary | ICD-10-CM | POA: Diagnosis not present

## 2023-05-18 DIAGNOSIS — R571 Hypovolemic shock: Secondary | ICD-10-CM

## 2023-05-18 DIAGNOSIS — K37 Unspecified appendicitis: Secondary | ICD-10-CM | POA: Diagnosis present

## 2023-05-18 DIAGNOSIS — E876 Hypokalemia: Secondary | ICD-10-CM | POA: Diagnosis present

## 2023-05-18 DIAGNOSIS — E872 Acidosis, unspecified: Secondary | ICD-10-CM | POA: Diagnosis present

## 2023-05-18 DIAGNOSIS — K659 Peritonitis, unspecified: Secondary | ICD-10-CM

## 2023-05-18 DIAGNOSIS — E861 Hypovolemia: Secondary | ICD-10-CM | POA: Diagnosis present

## 2023-05-18 DIAGNOSIS — R1031 Right lower quadrant pain: Secondary | ICD-10-CM | POA: Diagnosis present

## 2023-05-18 DIAGNOSIS — I959 Hypotension, unspecified: Principal | ICD-10-CM | POA: Diagnosis present

## 2023-05-18 DIAGNOSIS — Z23 Encounter for immunization: Secondary | ICD-10-CM | POA: Diagnosis not present

## 2023-05-18 LAB — CBC
HCT: 34.8 % — ABNORMAL LOW (ref 36.0–46.0)
Hemoglobin: 11.8 g/dL — ABNORMAL LOW (ref 12.0–15.0)
MCH: 27.1 pg (ref 26.0–34.0)
MCHC: 33.9 g/dL (ref 30.0–36.0)
MCV: 79.8 fL — ABNORMAL LOW (ref 80.0–100.0)
Platelets: 312 10*3/uL (ref 150–400)
RBC: 4.36 MIL/uL (ref 3.87–5.11)
RDW: 13.2 % (ref 11.5–15.5)
WBC: 12.4 10*3/uL — ABNORMAL HIGH (ref 4.0–10.5)
nRBC: 0 % (ref 0.0–0.2)

## 2023-05-18 LAB — BASIC METABOLIC PANEL WITH GFR
Anion gap: 13 (ref 5–15)
BUN: 5 mg/dL — ABNORMAL LOW (ref 6–20)
CO2: 17 mmol/L — ABNORMAL LOW (ref 22–32)
Calcium: 8.5 mg/dL — ABNORMAL LOW (ref 8.9–10.3)
Chloride: 106 mmol/L (ref 98–111)
Creatinine, Ser: 0.72 mg/dL (ref 0.44–1.00)
GFR, Estimated: >60 mL/min (ref >60)
Glucose, Bld: 118 mg/dL — ABNORMAL HIGH (ref 70–99)
Potassium: 3.3 mmol/L — ABNORMAL LOW (ref 3.5–5.1)
Sodium: 136 mmol/L (ref 135–145)

## 2023-05-18 LAB — LACTIC ACID, PLASMA: Lactic Acid, Venous: 1.6 mmol/L (ref 0.5–1.9)

## 2023-05-18 LAB — PHOSPHORUS: Phosphorus: 2.6 mg/dL (ref 2.5–4.6)

## 2023-05-18 LAB — MAGNESIUM
Magnesium: 1.8 mg/dL (ref 1.7–2.4)
Magnesium: 1.8 mg/dL (ref 1.7–2.4)

## 2023-05-18 LAB — HIV ANTIBODY (ROUTINE TESTING W REFLEX): HIV Screen 4th Generation wRfx: NONREACTIVE

## 2023-05-18 LAB — MRSA NEXT GEN BY PCR, NASAL: MRSA by PCR Next Gen: NOT DETECTED

## 2023-05-18 MED ORDER — MAGNESIUM SULFATE 2 GM/50ML IV SOLN
2.0000 g | Freq: Once | INTRAVENOUS | Status: AC
  Administered 2023-05-18: 2 g via INTRAVENOUS
  Filled 2023-05-18: qty 50

## 2023-05-18 MED ORDER — FENTANYL CITRATE PF 50 MCG/ML IJ SOSY
25.0000 ug | PREFILLED_SYRINGE | Freq: Three times a day (TID) | INTRAMUSCULAR | Status: DC | PRN
  Filled 2023-05-18 (×2): qty 1

## 2023-05-18 MED ORDER — ONDANSETRON HCL 4 MG/2ML IJ SOLN
4.0000 mg | Freq: Four times a day (QID) | INTRAMUSCULAR | Status: DC | PRN
  Administered 2023-05-19 – 2023-05-22 (×3): 4 mg via INTRAVENOUS
  Filled 2023-05-18 (×3): qty 2

## 2023-05-18 MED ORDER — DOCUSATE SODIUM 100 MG PO CAPS: 100.0000 mg | ORAL_CAPSULE | Freq: Two times a day (BID) | ORAL | Status: DC | PRN

## 2023-05-18 MED ORDER — ENOXAPARIN SODIUM 40 MG/0.4ML IJ SOSY
40.0000 mg | PREFILLED_SYRINGE | INTRAMUSCULAR | Status: DC
  Administered 2023-05-18 – 2023-05-22 (×5): 40 mg via SUBCUTANEOUS
  Filled 2023-05-18 (×5): qty 0.4

## 2023-05-18 MED ORDER — LORAZEPAM 2 MG/ML IJ SOLN: 0.5000 mg | Freq: Four times a day (QID) | INTRAMUSCULAR | Status: DC | PRN

## 2023-05-18 MED ORDER — LACTATED RINGERS IV SOLN: INTRAVENOUS | Status: DC

## 2023-05-18 MED ORDER — POTASSIUM CHLORIDE 10 MEQ/100ML IV SOLN
10.0000 meq | INTRAVENOUS | Status: AC
  Administered 2023-05-18 (×4): 10 meq via INTRAVENOUS
  Filled 2023-05-18 (×4): qty 100

## 2023-05-18 MED ORDER — SODIUM BICARBONATE 8.4 % IV SOLN
100.0000 meq | Freq: Once | INTRAVENOUS | Status: AC
Start: 2023-05-18 — End: 2023-05-18
  Administered 2023-05-18: 100 meq via INTRAVENOUS
  Filled 2023-05-18: qty 100

## 2023-05-18 MED ORDER — LORAZEPAM 2 MG/ML IJ SOLN
INTRAMUSCULAR | Status: AC
  Administered 2023-05-18: 0.5 mg via INTRAVENOUS
  Filled 2023-05-18: qty 1

## 2023-05-18 MED ORDER — CHLORHEXIDINE GLUCONATE CLOTH 2 % EX PADS
6.0000 | MEDICATED_PAD | Freq: Every day | CUTANEOUS | Status: DC
  Administered 2023-05-18 – 2023-05-22 (×5): 6 via TOPICAL

## 2023-05-18 MED ORDER — ACETAMINOPHEN 500 MG PO TABS
500.0000 mg | ORAL_TABLET | Freq: Four times a day (QID) | ORAL | Status: DC | PRN
  Administered 2023-05-20: 500 mg via ORAL
  Filled 2023-05-18: qty 1

## 2023-05-18 MED ORDER — LACTATED RINGERS IV BOLUS
1000.0000 mL | Freq: Once | INTRAVENOUS | Status: AC
  Administered 2023-05-18: 1000 mL via INTRAVENOUS

## 2023-05-18 MED ORDER — POLYETHYLENE GLYCOL 3350 17 G PO PACK: 17.0000 g | PACK | Freq: Every day | ORAL | Status: DC | PRN

## 2023-05-18 MED ORDER — ORAL CARE MOUTH RINSE: 15.0000 mL | OROMUCOSAL | Status: DC | PRN

## 2023-05-18 NOTE — Progress Notes (Signed)
 CBG on admission not crossing over. CBG 95

## 2023-05-18 NOTE — H&P (Addendum)
 NAMEDaneshia Fischer, MRN:  [id number], DOB:  01/17/1991, LOS: 0 ADMISSION DATE:  05/17/2023, CONSULTATION DATE:  05/18/23 REFERRING MD:  Amanda Fischer, CHIEF COMPLAINT:  abdominal pain   History of Present Illness:  Amanda Fischer is a 33 y.o. F G4P3003 with PMH significant for depression who is [redacted] weeks pregnant and presents to the ED with over one week of severe RLQ abdominal pain and emesis.  She has not established with OB for this pregnancy yet.  She speaks some English, but her husband reports that she has been able to tolerate very little by mouth.   In the ED her lactic acid was elevated to 2.8, no UTI, WBC 11.4, Na 134, K 3.4, Creatinine 0.68.  She underwent a transvaginal US which showed a 12week intrauterine pregnancy.  Given her RLQ pain a CT abd/pelvis was obtained showing peri- cecal inflammatory fat stranding inferior to the cecal tip which appears adjacent to  the appendix and is most in keeping with changes of epiploic appendagitis.  She was given Unasyn and 1L IVF, however required norepinephrine to maintain MAP >65, so PCCM consulted for admission  Pertinent  Medical History   has a past medical history of Depression, Headache, and Hypotension.   Significant Hospital Events: Including procedures, antibiotic start and stop dates in addition to other pertinent events   3/29 admit to PCCM  Interim History / Subjective:  Feeling improved and wants to eat   Objective   Blood pressure 93/61, pulse (!) 58, temperature 98.1 F (36.7 C), temperature source Oral, resp. rate 15, height 5\' 1"  (1.549 m), weight 49.9 kg, SpO2 100%, unknown if currently breastfeeding.        Intake/Output Summary (Last 24 hours) at 05/18/2023 0006 Last data filed at 05/17/2023 2118 Gross per 24 hour  Intake 2100 ml  Output 200 ml  Net 1900 ml   Filed Weights   05/17/23 1845  Weight: 49.9 kg    General:  well nourished young F resting in bed in  NAD HEENT: MM pink/moist Neuro: alert, answering  questions appropriately and moving all extremities CV: s1s2 rrr, no m/r/g PULM:  clear bilaterally on RA GI: soft, minimally TTP RLQ, no peritoneal signs Extremities: warm/dry, no edema  Skin: no rashes or lesions   Resolved Hospital Problem list     Assessment & Plan:   Epiploic appendagitis with subsequent severe abdominal pain and vomiting and hypovolemic shock Hypokalemia -admit to ICU and continue norepi to maintain MAP >65, currently requiring -give additional 1L LR and continue maintenance IVF -lactic acid down-trending to 2.2 -zofran, prn tylenol and fentanyl -diet as tolerated -as epiploic appendagitis is self-limiting, do not see an indication for further abx at this time, cultures are pending -replete K with 51meq's, check magnesium and repeat labs in the AM    Best Practice (right click and "Reselect all SmartList Selections" daily)   Diet/type: Regular consistency (see orders) DVT prophylaxis SCD Pressure ulcer(s): N/A GI prophylaxis: N/A Lines: N/A Foley:  N/A Code Status:  full code Last date of multidisciplinary goals of care discussion [husband and pt updated together at the bedside]  Labs   CBC: Recent Labs  Lab 05/17/23 1850  WBC 11.4*  HGB 13.8  HCT 41.4  MCV 80.7  PLT 341    Basic Metabolic Panel: Recent Labs  Lab 05/17/23 2005  NA 134*  K 3.4*  CL 103  CO2 20*  GLUCOSE 82  BUN 10  CREATININE 0.68  CALCIUM 8.3*  GFR: Estimated Creatinine Clearance: 76.2 mL/min (by C-G formula based on SCr of 0.68 mg/dL). Recent Labs  Lab 05/17/23 1850 05/17/23 1926 05/17/23 2041  WBC 11.4*  --   --   LATICACIDVEN  --  2.8* 2.2*    Liver Function Tests: Recent Labs  Lab 05/17/23 2005  AST 19  ALT 18  ALKPHOS 29*  BILITOT 0.4  PROT 6.2*  ALBUMIN 2.9*   Recent Labs  Lab 05/17/23 2005  LIPASE 53*   No results for input(s): "AMMONIA" in the last 168 hours.  ABG No results found for: "PHART", "PCO2ART", "PO2ART", "HCO3",  "TCO2", "ACIDBASEDEF", "O2SAT"   Coagulation Profile: Recent Labs  Lab 05/17/23 1850  INR 1.0    Cardiac Enzymes: No results for input(s): "CKTOTAL", "CKMB", "CKMBINDEX", "TROPONINI" in the last 168 hours.  HbA1C: No results found for: "HGBA1C"  CBG: No results for input(s): "GLUCAP" in the last 168 hours.  Review of Systems:   Please see the history of present illness. All other systems reviewed and are negative    Past Medical History:  She,  has a past medical history of Depression, Headache, and Hypotension.   Surgical History:   Past Surgical History:  Procedure Laterality Date   TONSILLECTOMY       Social History:   reports that she has never smoked. She has never used smokeless tobacco. She reports that she does not drink alcohol and does not use drugs.   Family History:  Her family history is not on file.   Allergies No Known Allergies   Home Medications  Prior to Admission medications   Medication Sig Start Date End Date Taking? Authorizing Provider  acetaminophen (TYLENOL) 500 MG tablet Take 1,000 mg by mouth daily as needed for moderate pain (pain score 4-6).   Yes [provider]     Critical care time:  50 minutes    CRITICAL CARE Performed by: Darcella Gasman Yan Okray   Total critical care time: 50 minutes  Critical care time was exclusive of separately billable procedures and treating other patients.  Critical care was necessary to treat or prevent imminent or life-threatening deterioration.  Critical care was time spent personally by me on the following activities: development of treatment plan with patient and/or surrogate as well as nursing, discussions with consultants, evaluation of patient's response to treatment, examination of patient, obtaining history from patient or surrogate, ordering and performing treatments and interventions, ordering and review of laboratory studies, ordering and review of radiographic studies, pulse oximetry  and re-evaluation of patient's condition.    Darcella Gasman Hamilton Marinello, PA-C [facility] See Amion for pager If no response to pager , please call [phone] until 7pm After 7:00 pm call Elink  [phone]

## 2023-05-18 NOTE — Progress Notes (Signed)
 NAMEGiliana Fischer, MRN:  [id number], DOB:  1990-11-04, LOS: 0 ADMISSION DATE:  05/17/2023, CONSULTATION DATE:  05/18/23 REFERRING MD:  Doran Durand, CHIEF COMPLAINT:  abdominal pain   History of Present Illness:  Amanda Fischer is a 33 y.o. F G4P3003 with PMH significant for depression who is [redacted] weeks pregnant and presents to the ED with over one week of severe RLQ abdominal pain and emesis.  She has not established with OB for this pregnancy yet.  She speaks some English, but her husband reports that she has been able to tolerate very little by mouth.   In the ED her lactic acid was elevated to 2.8, no UTI, WBC 11.4, Na 134, K 3.4, Creatinine 0.68.  She underwent a transvaginal US which showed a 12week intrauterine pregnancy.  Given her RLQ pain a CT abd/pelvis was obtained showing peri- cecal inflammatory fat stranding inferior to the cecal tip which appears adjacent to  the appendix and is most in keeping with changes of epiploic appendagitis.  She was given Unasyn and 1L IVF, however required norepinephrine to maintain MAP >65, so PCCM consulted for admission  Pertinent  Medical History   has a past medical history of Depression, Headache, and Hypotension.   Significant Hospital Events: Including procedures, antibiotic start and stop dates in addition to other pertinent events   3/29 admit to PCCM  Interim History / Subjective:  Seen sitting up in bed after eating breakfast with no acute complaints.  Reports abdominal pain has resolved  Objective   Blood pressure 92/60, pulse 72, temperature 98.4 F (36.9 C), temperature source Oral, resp. rate 17, height 5\' 1"  (1.549 m), weight 53.4 kg, SpO2 100%, unknown if currently breastfeeding.        Intake/Output Summary (Last 24 hours) at 05/18/2023 0913 Last data filed at 05/18/2023 0600 Gross per 24 hour  Intake 4770.36 ml  Output 1400 ml  Net 3370.36 ml   Filed Weights   05/17/23 1845 05/18/23 0500  Weight: 49.9 kg 53.4 kg   Physical Exam   General: Well-appearing adult female sitting up in bed in no acute distress HEENT: [place]/AT, MM pink/moist, PERRL,  Neuro: Alert and oriented x 3, nonfocal CV: s1s2 regular rate and rhythm, no murmur, rubs, or gallops,  PULM: Clear to auscultation bilaterally, no increased work of breathing, no added breath sounds GI: soft, bowel sounds active in all 4 quadrants, non-tender, non-distended, tolerating oral diet Extremities: warm/dry, no edema  Skin: no rashes or lesions  Resolved Hospital Problem list     Assessment & Plan:  Hypovolemic shock -Likely secondary to poor oral intake and vomiting. She was able to tolerate an oral diet this am  P: Pressors for MAP goal greater than 65 Continuous telemetry elevated Encourage oral hydration Consider possible albumin supplementation  Epiploic appendagitis with subsequent severe abdominal pain and vomiting -as epiploic appendagitis is self-limiting, do not see an indication for further abx at this time, cultures are pending P: Supportive care  IV hydration  Diet as tolerated   Hypokalemia P: Supplement as needed  Trend BMP   Protein calorie malnutrition Hypoalbuminemia P: Encourage oral intake  Best Practice (right click and "Reselect all SmartList Selections" daily)   Diet/type: Regular consistency (see orders) DVT prophylaxis SCD Pressure ulcer(s): N/A GI prophylaxis: N/A Lines: N/A Foley:  N/A Code Status:  full code Last date of multidisciplinary goals of care discussion [husband and pt updated together at the bedside]  Critical care time:  30 mins additional critical  care     Barbi Kumagai D. Harris, NP-C [facility] Personal contact information can be found on Amion  If no contact or response made please call [phone] 05/18/2023, 10:01 AM

## 2023-05-18 NOTE — Progress Notes (Signed)
 eLink Physician-Brief Progress Note Patient Name: Lenice Koper DOB: 12/18/1990 MRN: [id number]   Date of Service  05/18/2023  HPI/Events of Note  Pregnant female admitted with sepsis secondary to appendicitis. Patient also complaining of feeling numb all over.  eICU Interventions  New Patient Evaluation.        Thomasene Lot Aarron Wierzbicki 05/18/2023, 1:34 AM

## 2023-05-18 NOTE — Progress Notes (Signed)
 eLink Physician-Brief Progress Note Patient Name: Amanda Fischer DOB: 1991/01/22 MRN: [id number]   Date of Service  05/18/2023  HPI/Events of Note  RN requesting renewal of maintenance IV fluids.  eICU Interventions  Chart reviewed.  CCM for daytime mentioned to maintain IV fluids.  Renewed order.  Bedside RN informed.     Intervention Category Minor Interventions: Routine modifications to care plan (e.g. PRN medications for pain, fever)  Carilyn Goodpasture 05/18/2023, 8:33 PM

## 2023-05-19 DIAGNOSIS — E861 Hypovolemia: Secondary | ICD-10-CM

## 2023-05-19 LAB — CBC
HCT: 32.4 % — ABNORMAL LOW (ref 36.0–46.0)
Hemoglobin: 10.9 g/dL — ABNORMAL LOW (ref 12.0–15.0)
MCH: 27.2 pg (ref 26.0–34.0)
MCHC: 33.6 g/dL (ref 30.0–36.0)
MCV: 80.8 fL (ref 80.0–100.0)
Platelets: 293 10*3/uL (ref 150–400)
RBC: 4.01 MIL/uL (ref 3.87–5.11)
RDW: 13.3 % (ref 11.5–15.5)
WBC: 9.9 10*3/uL (ref 4.0–10.5)
nRBC: 0 % (ref 0.0–0.2)

## 2023-05-19 LAB — URINE CULTURE: Culture: NO GROWTH

## 2023-05-19 LAB — BASIC METABOLIC PANEL WITH GFR
Anion gap: 6 (ref 5–15)
BUN: 5 mg/dL — ABNORMAL LOW (ref 6–20)
CO2: 21 mmol/L — ABNORMAL LOW (ref 22–32)
Calcium: 8.3 mg/dL — ABNORMAL LOW (ref 8.9–10.3)
Chloride: 110 mmol/L (ref 98–111)
Creatinine, Ser: 0.54 mg/dL (ref 0.44–1.00)
GFR, Estimated: >60 mL/min (ref >60)
Glucose, Bld: 86 mg/dL (ref 70–99)
Potassium: 3.6 mmol/L (ref 3.5–5.1)
Sodium: 137 mmol/L (ref 135–145)

## 2023-05-19 MED ORDER — LACTATED RINGERS IV BOLUS
1000.0000 mL | Freq: Once | INTRAVENOUS | Status: AC
Start: 2023-05-19 — End: 2023-05-19
  Administered 2023-05-19: 1000 mL via INTRAVENOUS

## 2023-05-19 NOTE — Discharge Summary (Incomplete)
 Physician Discharge Summary      Patient ID: Amanda Fischer MRN: [id number] DOB/AGE: 1990/06/13 33 y.o.  Admit date: 05/17/2023 Discharge date: 05/23/2023  Discharge Diagnoses:   Hypovolemic shock - improving Epiploic appendagitis with subsequent severe abdominal pain and vomiting Protein calorie malnutrition Hypoalbuminemia  Discharge summary   Ms. [name] is a 33 y.o. F [id number] with PMH significant for depression who is [redacted] weeks pregnant and presents to the ED with over one week of severe RLQ abdominal pain and emesis.  She has not established with OB for this pregnancy yet.  She speaks some English, but her husband reports that she has been able to tolerate very little by mouth.    In the ED her lactic acid was elevated to 2.8, no UTI, WBC 11.4, Na 134, K 3.4, Creatinine 0.68.  She underwent a transvaginal US which showed a 12week intrauterine pregnancy.  Given her RLQ pain a CT abd/pelvis was obtained showing peri- cecal inflammatory fat stranding inferior to the cecal tip which appears adjacent to  the appendix and is most in keeping with changes of epiploic appendagitis.  She was given Unasyn and 1L IVF, however required norepinephrine to maintain MAP >65, so PCCM consulted for admission  During admission patient received IV hydration with low-dose vasopressor support for hypovolemic shock in the setting of decreased oral intake and nausea/vomiting, lactic acid on admission 2.8.  With supportive care patient's abdominal pain quickly resolved where she remained hypotensive.  She has underwent an extensive medical workup thus far for hypotension with all workup within normal.  Per medical records it appears patient's blood pressure typically ranges mid to low 80s systolically with mid to low 50s diastolically equaling a MAP low 60s.  Afternoon of 4/2 decision was made to stop vasopressor support given and lactic of 3.1 in the setting of no signs of clinical infection.  Lactic completely cleared to  1.3 off vasopressors and midodrine indicating patient was stressed in the setting of vasoconstriction and that her normal hemodynamics range low.  By 4/3 abdominal pain remains resolved with stable hemodynamics per axillary A-line.  She is eating per baseline and ambulating per normal therefore she will be discharged home.  TOC consult placed for assistance in managing difficult social dynamics including housing issues and financial struggles.   Discharge Plan by Active Problems   Hypovolemic shock - resolved -During admission patient received IV hydration with low-dose vasopressor support for hypovolemic shock in the setting of decreased oral intake and nausea/vomiting, lactic acid on admission.  2.8. Normal hypotension at baseline -She has underwent an extensive medical workup thus far for hypotension with all workup within normal.  Per medical records it appears patient's blood pressure typically ranges mid to low 80s systolically with mid to low 50s diastolically equaling a MAP low 60s.  Afternoon of 4/2 decision was made to stop vasopressor support given and lactic of 3.1 in the setting of no signs of clinical infection.  Lactic completely cleared to 1.3 off vasopressors and midodrine indicating patient was stressed in the setting of vasoconstriction and that her normal hemodynamics range low.  By 4/3 abdominal pain remains resolved with stable hemodynamics per axillary A-line.  P: Encouraged establishment with PCP to monitor ongoing hemodynamics   Epiploic appendagitis with subsequent severe abdominal pain and vomiting - resolved -as epiploic appendagitis is self-limiting, do not see an indication for further abx at this time, cultures are pending -Patient is now able to tolerate oral diet with resolved gait abdominal pain  P: Encourage oral intake   Protein calorie malnutrition Hypoalbuminemia P: Encourage oral intake  Social determinants of health -Upon completing of normal  admission/discharge questions it was discovered that patient is struggling with financial concerns and housing issues.  TOC consult placed to assist with financial struggles and potential establishment with PCP   Significant Hospital tests/ studies  None  Procedures   None  Culture data/antimicrobials   Blood cultures remain negative to date Urine culture remains negative to date   Consults  None    Discharge Exam: BP (!) 71/48   Pulse (!) 110   Temp 98.5 F (36.9 C) (Oral)   Resp 15   Ht 5\' 1"  (1.549 m)   Wt 49.2 kg   LMP  (LMP Unknown)   SpO2 99%   BMI 20.49 kg/m   Physical exam General: Well-appearing adult female lying in bed in no acute distress HEENT: [place]/AT, MM pink/moist, PERRL,  Neuro: Alert and oriented x 3, not CV: s1s2 regular rate and rhythm, no murmur, rubs, or gallops,  PULM: Clear to auscultation bilaterally, no increased work of breathing, no added breath sounds GI: soft, bowel sounds active in all 4 quadrants, non-tender, non-distended, tolerating oral diet Extremities: warm/dry, no edema  Skin: no rashes or lesions  Labs at discharge   Lab Results  Component Value Date   CREATININE 0.76 05/23/2023   BUN 9 05/23/2023   NA 133 (L) 05/23/2023   K 3.2 (L) 05/23/2023   CL 104 05/23/2023   CO2 19 (L) 05/23/2023   Lab Results  Component Value Date   WBC 15.3 (H) 05/23/2023   HGB 10.7 (L) 05/23/2023   HCT 30.2 (L) 05/23/2023   MCV 79.1 (L) 05/23/2023   PLT 295 05/23/2023   Lab Results  Component Value Date   ALT 29 05/20/2023   AST 26 05/20/2023   ALKPHOS 35 (L) 05/20/2023   BILITOT 0.3 05/20/2023   Lab Results  Component Value Date   INR 1.0 05/17/2023    Current radiological studies    No results found.   Disposition:        Allergies as of 05/23/2023   No Known Allergies      Medication List     TAKE these medications    acetaminophen 500 MG tablet Commonly known as: TYLENOL Take 1,000 mg by mouth daily as needed  for moderate pain (pain score 4-6).         Follow-up appointment   None  Discharge Condition:    good  Signed:  Emmagrace Runkel D. Harris, NP-C [facility] Personal contact information can be found on Amion  If no contact or response made please call [phone] 05/23/2023, 9:12 AM

## 2023-05-19 NOTE — Progress Notes (Signed)
 NAMEModestine Fischer, MRN:  [id number], DOB:  1990/10/21, LOS: 1 ADMISSION DATE:  05/17/2023, CONSULTATION DATE:  05/19/23 REFERRING MD:  Doran Durand, CHIEF COMPLAINT:  abdominal pain   History of Present Illness:  Amanda Fischer is a 33 y.o. F G4P3003 with PMH significant for depression who is [redacted] weeks pregnant and presents to the ED with over one week of severe RLQ abdominal pain and emesis.  She has not established with OB for this pregnancy yet.  She speaks some English, but her husband reports that she has been able to tolerate very little by mouth.   In the ED her lactic acid was elevated to 2.8, no UTI, WBC 11.4, Na 134, K 3.4, Creatinine 0.68.  She underwent a transvaginal US which showed a 12week intrauterine pregnancy.  Given her RLQ pain a CT abd/pelvis was obtained showing peri- cecal inflammatory fat stranding inferior to the cecal tip which appears adjacent to  the appendix and is most in keeping with changes of epiploic appendagitis.  She was given Unasyn and 1L IVF, however required norepinephrine to maintain MAP >65, so PCCM consulted for admission  Pertinent  Medical History   has a past medical history of Depression, Headache, and Hypotension.   Significant Hospital Events: Including procedures, antibiotic start and stop dates in addition to other pertinent events   3/29 admit to [facility] 3/30 no acute events overnight, abdominal pain remains resolved.  Remains on 1 mcg of Levophed  Interim History / Subjective:  Seen sitting up in bed eating breakfast, denies any ongoing abdominal pain  Objective   Blood pressure (!) 80/62, pulse 75, temperature (!) 97.3 F (36.3 C), temperature source Oral, resp. rate 14, height 5\' 1"  (1.549 m), weight 49.1 kg, SpO2 100%, unknown if currently breastfeeding.        Intake/Output Summary (Last 24 hours) at 05/19/2023 0813 Last data filed at 05/19/2023 0600 Gross per 24 hour  Intake 2416.12 ml  Output 5250 ml  Net -2833.88 ml   Filed Weights    05/17/23 1845 05/18/23 0500 05/19/23 0500  Weight: 49.9 kg 53.4 kg 49.1 kg   Physical Exam  General: Well-appearing adult female lying in bed in no acute distress HEENT: [place]/AT, MM pink/moist, PERRL,  Neuro: Alert and oriented x 3, not CV: s1s2 regular rate and rhythm, no murmur, rubs, or gallops,  PULM: Clear to auscultation bilaterally, no increased work of breathing, no added breath sounds GI: soft, bowel sounds active in all 4 quadrants, non-tender, non-distended, tolerating oral diet Extremities: warm/dry, no edema  Skin: no rashes or lesions  Resolved Hospital Problem list   Hypokalemia  Assessment & Plan:  Hypovolemic shock - improving -Likely secondary to poor oral intake and vomiting. She was able to tolerate an oral diet this am  P: Continue pressors for MAP goal greater than 65, hope to be weaned off by today Continuous telemetry Encourage oral hydration  Epiploic appendagitis with subsequent severe abdominal pain and vomiting -as epiploic appendagitis is self-limiting, do not see an indication for further abx at this time, cultures are pending -Patient is now able to tolerate oral diet with resolved gait abdominal pain P: Supportive care IV hydration  Protein calorie malnutrition Hypoalbuminemia P: Encourage oral intake  Best Practice (right click and "Reselect all SmartList Selections" daily)   Diet/type: Regular consistency (see orders) DVT prophylaxis SCD Pressure ulcer(s): N/A GI prophylaxis: N/A Lines: N/A Foley:  N/A Code Status:  full code Last date of multidisciplinary goals of care discussion [  husband and pt updated together at the bedside]  Critical care time:    CRITICAL CARE Performed by: Gissele Narducci D. Harris   Total critical care time: 35 minutes  Critical care time was exclusive of separately billable procedures and treating other patients.  Critical care was necessary to treat or prevent imminent or life-threatening  deterioration.  Critical care was time spent personally by me on the following activities: development of treatment plan with patient and/or surrogate as well as nursing, discussions with consultants, evaluation of patient's response to treatment, examination of patient, obtaining history from patient or surrogate, ordering and performing treatments and interventions, ordering and review of laboratory studies, ordering and review of radiographic studies, pulse oximetry and re-evaluation of patient's condition.  Karol Liendo D. Harris, NP-C [facility] Personal contact information can be found on Amion  If no contact or response made please call [phone] 05/19/2023, 8:13 AM

## 2023-05-19 NOTE — Plan of Care (Signed)

## 2023-05-20 ENCOUNTER — Inpatient Hospital Stay (HOSPITAL_COMMUNITY): Payer: MEDICAID

## 2023-05-20 DIAGNOSIS — R571 Hypovolemic shock: Secondary | ICD-10-CM | POA: Diagnosis not present

## 2023-05-20 DIAGNOSIS — A419 Sepsis, unspecified organism: Secondary | ICD-10-CM | POA: Diagnosis not present

## 2023-05-20 DIAGNOSIS — O98811 Other maternal infectious and parasitic diseases complicating pregnancy, first trimester: Secondary | ICD-10-CM | POA: Diagnosis not present

## 2023-05-20 DIAGNOSIS — K6389 Other specified diseases of intestine: Secondary | ICD-10-CM

## 2023-05-20 DIAGNOSIS — Z3A12 12 weeks gestation of pregnancy: Secondary | ICD-10-CM

## 2023-05-20 DIAGNOSIS — Z23 Encounter for immunization: Secondary | ICD-10-CM | POA: Diagnosis not present

## 2023-05-20 LAB — STREP PNEUMONIAE URINARY ANTIGEN: Strep Pneumo Urinary Antigen: NEGATIVE

## 2023-05-20 LAB — CBC
HCT: 35.3 % — ABNORMAL LOW (ref 36.0–46.0)
Hemoglobin: 12 g/dL (ref 12.0–15.0)
MCH: 27.1 pg (ref 26.0–34.0)
MCHC: 34 g/dL (ref 30.0–36.0)
MCV: 79.7 fL — ABNORMAL LOW (ref 80.0–100.0)
Platelets: 337 10*3/uL (ref 150–400)
RBC: 4.43 MIL/uL (ref 3.87–5.11)
RDW: 13.2 % (ref 11.5–15.5)
WBC: 14.7 10*3/uL — ABNORMAL HIGH (ref 4.0–10.5)
nRBC: 0 % (ref 0.0–0.2)

## 2023-05-20 LAB — GLUCOSE, CAPILLARY
Glucose-Capillary: 95 mg/dL (ref 70–99)
Glucose-Capillary: 84 mg/dL (ref 70–99)
Glucose-Capillary: 110 mg/dL — ABNORMAL HIGH (ref 70–99)

## 2023-05-20 LAB — ECHOCARDIOGRAM COMPLETE
Area-P 1/2: 3.27 cm2
Height: 61 in
P 1/2 time: 481 ms
S' Lateral: 2.9 cm
Weight: 1731.93 [oz_av]

## 2023-05-20 LAB — COMPREHENSIVE METABOLIC PANEL WITH GFR
ALT: 29 U/L (ref 0–44)
AST: 26 U/L (ref 15–41)
Albumin: 3 g/dL — ABNORMAL LOW (ref 3.5–5.0)
Alkaline Phosphatase: 35 U/L — ABNORMAL LOW (ref 38–126)
Anion gap: 9 (ref 5–15)
BUN: 8 mg/dL (ref 6–20)
CO2: 19 mmol/L — ABNORMAL LOW (ref 22–32)
Calcium: 9 mg/dL (ref 8.9–10.3)
Chloride: 106 mmol/L (ref 98–111)
Creatinine, Ser: 0.7 mg/dL (ref 0.44–1.00)
GFR, Estimated: >60 mL/min (ref >60)
Glucose, Bld: 101 mg/dL — ABNORMAL HIGH (ref 70–99)
Potassium: 3.6 mmol/L (ref 3.5–5.1)
Sodium: 134 mmol/L — ABNORMAL LOW (ref 135–145)
Total Bilirubin: 0.3 mg/dL (ref 0.0–1.2)
Total Protein: 6.4 g/dL — ABNORMAL LOW (ref 6.5–8.1)

## 2023-05-20 LAB — PROCALCITONIN: Procalcitonin: <0.1 ng/mL

## 2023-05-20 LAB — CORTISOL: Cortisol, Plasma: 5 ug/dL

## 2023-05-20 MED ORDER — HYDROCORTISONE SOD SUC (PF) 100 MG IJ SOLR
100.0000 mg | Freq: Two times a day (BID) | INTRAMUSCULAR | Status: DC
  Administered 2023-05-20 – 2023-05-21 (×2): 100 mg via INTRAVENOUS
  Filled 2023-05-20 (×2): qty 2

## 2023-05-20 MED ORDER — LACTATED RINGERS IV SOLN: INTRAVENOUS | Status: AC

## 2023-05-20 MED ORDER — INSULIN ASPART 100 UNIT/ML IJ SOLN
0.0000 [IU] | INTRAMUSCULAR | Status: DC
  Administered 2023-05-21: 2 [IU] via SUBCUTANEOUS
  Administered 2023-05-21: 3 [IU] via SUBCUTANEOUS
  Administered 2023-05-21: 2 [IU] via SUBCUTANEOUS

## 2023-05-20 MED ORDER — SODIUM CHLORIDE 0.9 % IV SOLN
3.0000 g | Freq: Four times a day (QID) | INTRAVENOUS | Status: DC
  Administered 2023-05-20 – 2023-05-22 (×7): 3 g via INTRAVENOUS
  Filled 2023-05-20 (×7): qty 8

## 2023-05-20 MED ORDER — IPRATROPIUM-ALBUTEROL 0.5-2.5 (3) MG/3ML IN SOLN: 3.0000 mL | RESPIRATORY_TRACT | Status: DC | PRN

## 2023-05-20 MED ORDER — LACTATED RINGERS IV BOLUS
500.0000 mL | Freq: Once | INTRAVENOUS | Status: AC
  Administered 2023-05-20: 500 mL via INTRAVENOUS

## 2023-05-20 NOTE — Progress Notes (Signed)
 Pharmacy Antibiotic Note  Amanda Fischer is a 33 y.o. female admitted on 05/17/2023 with  intra-abdominal infection .  Pharmacy has been consulted for unasyn dosing.  Plan: Unasyn 3g IV q6h  F/u renal function, LOT, cutlure data as able  Height: 5\' 1"  (154.9 cm) Weight: 49.1 kg (108 lb 3.9 oz) IBW/kg (Calculated) : 47.8  Temp (24hrs), Avg:98.3 F (36.8 C), Min:98.2 F (36.8 C), Max:98.5 F (36.9 C)  Recent Labs  Lab 05/17/23 1850 05/17/23 1926 05/17/23 2005 05/17/23 2041 05/18/23 0112 05/18/23 0350 05/19/23 0441 05/20/23 1146  WBC 11.4*  --   --   --  12.4*  --  9.9 14.7*  CREATININE  --   --  0.68  --  0.72  --  0.54 0.70  LATICACIDVEN  --  2.8*  --  2.2*  --  1.6  --   --     Estimated Creatinine Clearance: 76.2 mL/min (by C-G formula based on SCr of 0.7 mg/dL).    No Known Allergies  Antimicrobials this admission: Unasyn 3/30>  Dose adjustments this admission:   Microbiology results: 3/29 MRSA neg 3/28 RVP neg 3/28 UCX- no growth 3/28 BCX NGx3  Thank you for allowing pharmacy to be a part of this patient's care.  Calton Dach, PharmD, BCCCP Clinical Pharmacist 05/20/2023 2:39 PM

## 2023-05-20 NOTE — Plan of Care (Signed)

## 2023-05-20 NOTE — Progress Notes (Signed)
  Echocardiogram 2D Echocardiogram has been performed.  Delcie Roch 05/20/2023, 4:55 PM

## 2023-05-20 NOTE — Progress Notes (Addendum)
 NAMEFantasia Fischer, MRN:  [id number], DOB:  1990/09/24, LOS: 2 ADMISSION DATE:  05/17/2023, CONSULTATION DATE:  05/20/23 REFERRING MD:  Doran Durand - EDP, CHIEF COMPLAINT: Abdominal pain   History of Present Illness:  Amanda Fischer is a 33 y.o. F G4P3003 with PMHx significant for depression who is [redacted] weeks pregnant and presents to the ED with over one week of severe RLQ abdominal pain and emesis.  She has not established with OB for this pregnancy yet.  She speaks some English, but her husband reports that she has been able to tolerate very little by mouth.   In the ED her lactic acid was elevated to 2.8, no UTI, WBC 11.4, Na 134, K 3.4, Creatinine 0.68.  She underwent a transvaginal US which showed a 12week intrauterine pregnancy.  Given her RLQ pain a CT abd/pelvis was obtained showing peri- cecal inflammatory fat stranding inferior to the cecal tip which appears adjacent to  the appendix and is most in keeping with changes of epiploic appendagitis.  She was given Unasyn and 1L IVF, however required norepinephrine to maintain MAP > 65, so PCCM consulted for admission.  Pertinent Medical History:   Past Medical History:  Diagnosis Date   Depression    PPD   Headache    Hypotension    Significant Hospital Events: Including procedures, antibiotic start and stop dates in addition to other pertinent events   3/29 Admit to [facility] 3/30 No acute events overnight, abdominal pain remains resolved.  Remains on 1 mcg of Levophed 3/31 Emesis overnight. Reports abdominal pain this am. Will wean off levophed today. Complete Echo pending.  Interim History / Subjective:  Continues to have pain. Remains on of levo. D/w OB re: concerns specific to pregnancy, none evident Updated patient and husband (at bedside) via video interpreter  Objective:   Blood pressure 100/64, pulse 80, temperature 98.2 F (36.8 C), temperature source Oral, resp. rate 18, height 5\' 1"  (1.549 m), weight 49.1 kg, SpO2 100%,  unknown if currently breastfeeding.        Intake/Output Summary (Last 24 hours) at 05/20/2023 0804 Last data filed at 05/20/2023 0800 Gross per 24 hour  Intake 1485.44 ml  Output 3100 ml  Net -1614.56 ml   Filed Weights   05/17/23 1845 05/18/23 0500 05/19/23 0500  Weight: 49.9 kg 53.4 kg 49.1 kg   Physical Examination (completed with assistance of video interpreter): General: Acutely ill-appearing young woman in NAD. Appears mildly uncomfortable. HEENT: [place]/AT, anicteric, moist mucous membranes. Neuro: Awake, oriented x 4. Responds to verbal stimuli. Following commands consistently. Moves all 4 extremities spontaneously.  CV: RRR, no m/g/r. PULM: Breathing even and unlabored on RA. Lung fields clear.  GI: Soft, diffusely tender most signifcant in RLQ, nondistended. Normoactive bowel sounds. Extremities: No LE edema noted. Skin: Warm/dry, No rashes.   Resolved Hospital Problem List:  Hypokalemia  Assessment & Plan:  Hypovolemic versus septic shock - improving Leukocytosis, increasing Likely secondary to poor oral intake and vomiting. She was able to tolerate an oral diet this am - Continue pressors for MAP goal > 65, weaning levo as able - F/u cortisol, hydrocortisone 100mg  Q12H - Fluid resuscitation in the setting of ongoing vomiting/ poor PO intake - Encourage oral hydration  - Continue telemetry  - F/u Echo, could also check beside POCUS  - Trend WBC, fever curve - F/u Cx data - Resume broad-spectrum antibiotics (Unasyn) for presumed intra-abdominal infection  Epiploic appendagitis with subsequent severe abdominal pain and vomiting As  epiploic appendagitis is self-limiting, do not see an indication for further abx at this time, cultures RVP/ Urine Culture negative - F/u finalized Blood cultures; low threshold for antibiotic reinitiation if ongoing pressor requirements - Supportive care / IV hydration  - Continue antiemetics  -  F/u EKG; interval QTc monitoring  -  Continue acetaminophen for pain management; Limited options for pain control agents d/t pregnancy  Protein calorie malnutrition Hypoalbuminemia - Encourage PO intake  - F/u repeat albumin - Continue regular diet as tolerated  Pregnancy status 3/31 D/w Dr. Donavan Foil (on-call OB-GYN) re: patient's pregnancy status; Dr. Donavan Foil reviewed TVUS and CT A/P. In his opinion, no significant contributions of pregnancy to patient's current abdominal discomfort/shock picture. - Consider formal OB-GYN consult if patient's clinical status worsens, c/f risk to pregnancy - No acute interventions/further studies needed at this time - Recommend outpatient prenatal care establishment upon discharge  Best Practice (right click and "Reselect all SmartList Selections" daily)   Diet/type: Regular consistency (see orders) DVT prophylaxis SCD Pressure ulcer(s): N/A GI prophylaxis: N/A Lines: N/A Foley:  N/A Code Status:  full code Last date of multidisciplinary goals of care discussion [husband and pt updated together at the bedside]  Critical care time:     The patient is critically ill with multiple organ system failure and requires high complexity decision making for assessment and support, frequent evaluation and titration of therapies, advanced monitoring, review of radiographic studies and interpretation of complex data.   Critical Care Time devoted to patient care services, exclusive of separately billable procedures, described in this note is 37 minutes.  Glorianne Manchester, NP-S [place], [place] [facility] 05/20/23 8:04 AM  Please see [email or web address] for pager details.  From 7A-7P if no response, please call [phone] After hours, please call ELink [phone]

## 2023-05-20 NOTE — Progress Notes (Signed)
 eLink Physician-Brief Progress Note Patient Name: Quenna Doepke DOB: [birth year] MRN: [id number]   Date of Service  05/20/2023  HPI/Events of Note  Shock. Low dose levophed requirement. Started on stress dose steroids and abx today with unclear source  eICU Interventions  AM CBC and BMET ordered     Intervention Category Minor Interventions: Clinical assessment - ordering diagnostic tests  Yong Grieser Mechele Collin 05/20/2023, 10:45 PM

## 2023-05-20 NOTE — TOC CM/SW Note (Signed)
 Transition of Care [facility]) - Inpatient Brief Assessment   Patient Details  Name: Amanda Fischer MRN: [id number] Date of Birth: [birth year]  Transition of Care [facility]) CM/SW Contact:    Tom-Johnson, Hershal Coria, RN Phone Number: 05/20/2023, 1:17 PM   Clinical Narrative:  Patient presented to the ED with Abdominal pain, poor appetite, N/V,  Constipation, subjective Fever and Emesis with blood. Admitted with Hypotension. On Levophed and supportive care.   Patient states she is currently [redacted] weeks pregnant and has not yet established OBGYN care. States she will schedule appointment with her previous OBGYN.   Patient is [place] and requires an interpreter. CM spoke with patient and husband at bedside about discharge disposition with the aid of [employer] interpreter.  From home with husband and three children and currently pregnant. Currently not employed, not on disability and does not have a PCP. PCP will be established once patient is Medically ready for discharge. Independent with care. Has good family support. Uses [facility] on [address].   No TOC needs or recommendations noted at this time.  Patient not Medically ready for discharge.  CM will continue to follow as patient progresses with care towards discharge.              Transition of Care Asessment:

## 2023-05-21 LAB — BASIC METABOLIC PANEL WITH GFR
Anion gap: 8 (ref 5–15)
BUN: 8 mg/dL (ref 6–20)
CO2: 21 mmol/L — ABNORMAL LOW (ref 22–32)
Calcium: 9.1 mg/dL (ref 8.9–10.3)
Chloride: 106 mmol/L (ref 98–111)
Creatinine, Ser: 0.73 mg/dL (ref 0.44–1.00)
GFR, Estimated: >60 mL/min (ref >60)
Glucose, Bld: 125 mg/dL — ABNORMAL HIGH (ref 70–99)
Potassium: 4.1 mmol/L (ref 3.5–5.1)
Sodium: 135 mmol/L (ref 135–145)

## 2023-05-21 LAB — GLUCOSE, CAPILLARY
Glucose-Capillary: 209 mg/dL — ABNORMAL HIGH (ref 70–99)
Glucose-Capillary: 103 mg/dL — ABNORMAL HIGH (ref 70–99)
Glucose-Capillary: 169 mg/dL — ABNORMAL HIGH (ref 70–99)
Glucose-Capillary: 170 mg/dL — ABNORMAL HIGH (ref 70–99)

## 2023-05-21 LAB — CBC
HCT: 36.6 % (ref 36.0–46.0)
Hemoglobin: 12.6 g/dL (ref 12.0–15.0)
MCH: 27.3 pg (ref 26.0–34.0)
MCHC: 34.4 g/dL (ref 30.0–36.0)
MCV: 79.2 fL — ABNORMAL LOW (ref 80.0–100.0)
Platelets: 348 10*3/uL (ref 150–400)
RBC: 4.62 MIL/uL (ref 3.87–5.11)
RDW: 13.2 % (ref 11.5–15.5)
WBC: 15.4 10*3/uL — ABNORMAL HIGH (ref 4.0–10.5)
nRBC: 0 % (ref 0.0–0.2)

## 2023-05-21 LAB — TSH: TSH: 0.326 u[IU]/mL — ABNORMAL LOW (ref 0.350–4.500)

## 2023-05-21 MED ORDER — LACTATED RINGERS IV BOLUS
1000.0000 mL | Freq: Once | INTRAVENOUS | Status: AC
  Administered 2023-05-21: 1000 mL via INTRAVENOUS

## 2023-05-21 MED ORDER — MIDODRINE HCL 5 MG PO TABS
5.0000 mg | ORAL_TABLET | Freq: Three times a day (TID) | ORAL | Status: DC
  Administered 2023-05-21 – 2023-05-22 (×3): 5 mg via ORAL
  Filled 2023-05-21 (×3): qty 1

## 2023-05-21 MED ORDER — MIDODRINE HCL 5 MG PO TABS: 5.0000 mg | ORAL_TABLET | Freq: Three times a day (TID) | ORAL | Status: DC

## 2023-05-21 NOTE — Plan of Care (Signed)

## 2023-05-21 NOTE — Progress Notes (Signed)
 SBP 79/46 (56) despite midodrine given .Levophed restarted.Amanda Fischer secure chat about persistent low BP.

## 2023-05-21 NOTE — Progress Notes (Addendum)
 NAMEVesta Fischer, MRN:  [id number], DOB:  11/10/1990, LOS: 3 ADMISSION DATE:  05/17/2023, CONSULTATION DATE:  05/17/2023 REFERRING MD:  Doran Durand - EDP, CHIEF COMPLAINT: Abdominal pain   History of Present Illness:  [name] is a 33 y.o. F G4P3003 with PMHx significant for depression who is [redacted] weeks pregnant and presents to the ED with over one week of severe RLQ abdominal pain and emesis.  She has not established with OB for this pregnancy yet.  She speaks some English, but her husband reports that she has been able to tolerate very little by mouth.   In the ED her lactic acid was elevated to 2.8, no UTI, WBC 11.4, Na 134, K 3.4, Creatinine 0.68.  She underwent a transvaginal US which showed a 12week intrauterine pregnancy.  Given her RLQ pain a CT abd/pelvis was obtained showing peri- cecal inflammatory fat stranding inferior to the cecal tip which appears adjacent to  the appendix and is most in keeping with changes of epiploic appendagitis.  She was given Unasyn and 1L IVF, however required norepinephrine to maintain MAP > 65, so PCCM consulted for admission.  Pertinent Medical History:   Past Medical History:  Diagnosis Date   Depression    PPD   Headache    Hypotension    Significant Hospital Events: Including procedures, antibiotic start and stop dates in addition to other pertinent events   3/29 Ad[facility] to [facility] 3/30 No acute events overnight, abdominal pain remains resolved.  Remains on 1 mcg of Levophed 3/31 Emesis overnight. Reports abdominal pain this am. Will wean off levophed today. Complete Echo showed EF 55-60% ; Normal LV/RV function.  4/1 Levophed stopped.   Interim History / Subjective:  No significant events overnight Levo off this AM Giving LR bolus for volume Eating better, denies abdominal pain/nausea/vomiting/diarrhea Hopeful to go home tomorrow  Objective:   Blood pressure (!) 85/60, pulse 75, temperature 98.5 F (36.9 C), temperature source Oral, resp. rate  19, height 5\' 1"  (1.549 m), weight 49.1 kg, SpO2 99%, unknown if currently breastfeeding.        Intake/Output Summary (Last 24 hours) at 05/21/2023 0723 Last data filed at 05/21/2023 0700 Gross per 24 hour  Intake 2096.64 ml  Output 2600 ml  Net -503.36 ml   Filed Weights   05/17/23 1845 05/18/23 0500 05/19/23 0500  Weight: 49.9 kg 53.4 kg 49.1 kg   Physical Examination (completed with assistance of video interpreter): General: Acutely ill-appearing woman in NAD, improved from prior. HEENT: /AT, anicteric sclera, moist mucous membranes. Neuro: Awake, oriented x 4. Responds to verbal stimuli. Following commands consistently. Moves all 4 extremities spontaneously.   CV: RRR, no m/g/r. PULM: Breathing even and unlabored on RA, lung fields clear. GI: Soft, mildly tender to palpation, nondistended. Normoactive bowel sounds. Extremities: No LE edema noted. Skin: Warm/dry, No rashes.   Resolved Hospital Problem List:  Hypokalemia  Assessment & Plan:  Hypovolemic versus septic shock - improving Leukocytosis, increasing 3/31 Echo showed EF 55-60%, Normal  RV/LV function. Hypovolemic shock likely secondary to poor oral intake and vomiting. She was able to tolerate an oral diet 4/1AM. - Levophed stopped 4/1 - Cortisol 5.0; DC'd Hydrocortisone after 2 doses - Midodrine 5mg  Q8H added 4/1 - Encourage oral hydration, additional 1L LR bolus given 4/1 - Continue Cardiac Monitoring  - Trend WBC, 15.4 (4/1) - F/u Cx data  - Continue broad spectrum antibiotics (Unasyn) through 4/2  - Plan for discharge 4/2 pending improved BP and  maintenance of MAP > 65   Epiploic appendagitis with subsequent severe abdominal pain and vomiting As epiploic appendagitis is self-limiting, do not see an indication for further abx at this time, cultures RVP/Urine Culture negative. 3/31 EKG QTc: 412. - F/u finalized blood cultures; No growth x 4 days as of 4/1 - Supportive care/IV hydration  - Continue antiemetics  PRN; has not required since 3/31 - Continue acetaminophen for pain management; Limited options for pain control agents d/t pregnancy  Protein calorie malnutrition Hypoalbuminemia - Continue PO intake  - 3/31 Albumin 3.0; encourage improved nutrition now that nausea has resolved - Continue regular diet as tolerated  Pregnancy status 3/31 D/w Dr. Donavan Foil (on-call OB-GYN) re: patient's pregnancy status; Dr. Donavan Foil reviewed TVUS and CT A/P. In his opinion, no significant contributions of pregnancy to patient's current abdominal discomfort/shock picture. - Consider formal OB- GYN consults if patient's clinical status worsens, c/f risk to pregnancy - No acute interventions/ further studies needed at this time  - Recommend outpatient prenatal care establishment upon discharge   Best Practice (right click and "Reselect all SmartList Selections" daily)   Diet/type: Regular consistency (see orders) DVT prophylaxis SCD Pressure ulcer(s): N/A GI prophylaxis: N/A Lines: N/A Foley:  N/A Code Status:  full code Last date of multidisciplinary goals of care discussion [husband and pt updated together at the bedside 4/1]  Critical care time:     The patient is critically ill with multiple organ system failure and requires high complexity decision making for assessment and support, frequent evaluation and titration of therapies, advanced monitoring, review of radiographic studies and interpretation of complex data.   Critical Care Time devoted to patient care services, exclusive of separately billable procedures, described in this note is 33 minutes.  Amanda Manchester, NP-S [place], [place] [facility] 05/21/23 7:23 AM  Please see [email or web address] for pager details.  From 7A-7P if no response, please call [phone] After hours, please call ELink [phone]

## 2023-05-22 DIAGNOSIS — R579 Shock, unspecified: Secondary | ICD-10-CM

## 2023-05-22 LAB — BASIC METABOLIC PANEL WITH GFR
Anion gap: 8 (ref 5–15)
BUN: 7 mg/dL (ref 6–20)
CO2: 24 mmol/L (ref 22–32)
Calcium: 8.2 mg/dL — ABNORMAL LOW (ref 8.9–10.3)
Chloride: 104 mmol/L (ref 98–111)
Creatinine, Ser: 0.76 mg/dL (ref 0.44–1.00)
GFR, Estimated: >60 mL/min (ref >60)
Glucose, Bld: 101 mg/dL — ABNORMAL HIGH (ref 70–99)
Potassium: 3.9 mmol/L (ref 3.5–5.1)
Sodium: 136 mmol/L (ref 135–145)

## 2023-05-22 LAB — RETICULOCYTES
Immature Retic Fract: 25.3 % — ABNORMAL HIGH (ref 2.3–15.9)
RBC.: 3.4 MIL/uL — ABNORMAL LOW (ref 3.87–5.11)
Retic Count, Absolute: 82.3 10*3/uL (ref 19.0–186.0)
Retic Ct Pct: 2.4 % (ref 0.4–3.1)

## 2023-05-22 LAB — CBC WITH DIFFERENTIAL/PLATELET
Abs Immature Granulocytes: 0.06 10*3/uL (ref 0.00–0.07)
Basophils Absolute: 0 10*3/uL (ref 0.0–0.1)
Basophils Relative: 0 %
Eosinophils Absolute: 0.2 10*3/uL (ref 0.0–0.5)
Eosinophils Relative: 2 %
HCT: 33 % — ABNORMAL LOW (ref 36.0–46.0)
Hemoglobin: 11.1 g/dL — ABNORMAL LOW (ref 12.0–15.0)
Immature Granulocytes: 1 %
Lymphocytes Relative: 27 %
Lymphs Abs: 2.9 10*3/uL (ref 0.7–4.0)
MCH: 27.1 pg (ref 26.0–34.0)
MCHC: 33.6 g/dL (ref 30.0–36.0)
MCV: 80.7 fL (ref 80.0–100.0)
Monocytes Absolute: 0.6 10*3/uL (ref 0.1–1.0)
Monocytes Relative: 6 %
Neutro Abs: 7 10*3/uL (ref 1.7–7.7)
Neutrophils Relative %: 64 %
Platelets: 299 10*3/uL (ref 150–400)
RBC: 4.09 MIL/uL (ref 3.87–5.11)
RDW: 13.5 % (ref 11.5–15.5)
WBC: 10.8 10*3/uL — ABNORMAL HIGH (ref 4.0–10.5)
nRBC: 0 % (ref 0.0–0.2)

## 2023-05-22 LAB — GLUCOSE, CAPILLARY
Glucose-Capillary: 79 mg/dL (ref 70–99)
Glucose-Capillary: 86 mg/dL (ref 70–99)
Glucose-Capillary: 97 mg/dL (ref 70–99)

## 2023-05-22 LAB — FOLATE: Folate: 6.9 ng/mL (ref >5.9)

## 2023-05-22 LAB — OSMOLALITY: Osmolality: 279 mosm/kg (ref 275–295)

## 2023-05-22 LAB — LACTIC ACID, PLASMA
Lactic Acid, Venous: 3.1 mmol/L (ref 0.5–1.9)
Lactic Acid, Venous: 2.2 mmol/L (ref 0.5–1.9)
Lactic Acid, Venous: 1.3 mmol/L (ref 0.5–1.9)

## 2023-05-22 LAB — LEGIONELLA PNEUMOPHILA SEROGP 1 UR AG: L. pneumophila Serogp 1 Ur Ag: NEGATIVE

## 2023-05-22 LAB — CULTURE, BLOOD (ROUTINE X 2)
Culture: NO GROWTH
Culture: NO GROWTH
Special Requests: ADEQUATE

## 2023-05-22 LAB — MAGNESIUM: Magnesium: 2 mg/dL (ref 1.7–2.4)

## 2023-05-22 LAB — SODIUM, URINE, RANDOM: Sodium, Ur: 184 mmol/L

## 2023-05-22 LAB — OSMOLALITY, URINE: Osmolality, Ur: 659 mosm/kg (ref 300–900)

## 2023-05-22 LAB — VITAMIN B12: Vitamin B-12: 371 pg/mL (ref 180–914)

## 2023-05-22 LAB — FERRITIN: Ferritin: 34 ng/mL (ref 11–307)

## 2023-05-22 LAB — IRON AND TIBC
Iron: 42 ug/dL (ref 28–170)
Saturation Ratios: 13 % (ref 10.4–31.8)
TIBC: 328 ug/dL (ref 250–450)
UIBC: 286 ug/dL

## 2023-05-22 LAB — T4, FREE: Free T4: 0.9 ng/dL (ref 0.61–1.12)

## 2023-05-22 MED ORDER — MIDODRINE HCL 5 MG PO TABS
5.0000 mg | ORAL_TABLET | Freq: Once | ORAL | Status: AC
  Administered 2023-05-22: 5 mg via ORAL
  Filled 2023-05-22: qty 1

## 2023-05-22 MED ORDER — COSYNTROPIN 0.25 MG IJ SOLR
0.2500 mg | Freq: Once | INTRAMUSCULAR | Status: AC
  Administered 2023-05-23: 0.25 mg via INTRAVENOUS
  Filled 2023-05-22: qty 0.25

## 2023-05-22 MED ORDER — INSULIN ASPART 100 UNIT/ML IJ SOLN
0.0000 [IU] | Freq: Three times a day (TID) | INTRAMUSCULAR | Status: DC
  Administered 2023-05-23: 2 [IU] via SUBCUTANEOUS

## 2023-05-22 MED ORDER — MIDODRINE HCL 5 MG PO TABS: 10.0000 mg | ORAL_TABLET | Freq: Three times a day (TID) | ORAL | Status: DC

## 2023-05-22 MED ORDER — DEXAMETHASONE SODIUM PHOSPHATE 10 MG/ML IJ SOLN
10.0000 mg | Freq: Once | INTRAMUSCULAR | Status: AC
  Administered 2023-05-22: 10 mg via INTRAVENOUS
  Filled 2023-05-22: qty 1

## 2023-05-22 MED ORDER — DOCUSATE SODIUM 100 MG PO CAPS
100.0000 mg | ORAL_CAPSULE | Freq: Two times a day (BID) | ORAL | Status: DC
  Administered 2023-05-22 – 2023-05-23 (×3): 100 mg via ORAL
  Filled 2023-05-22 (×3): qty 1

## 2023-05-22 NOTE — Progress Notes (Signed)
 Afternoon Rounds   After discussing case with attending decision was made to stop vasoopressor support and midodrine. Patients shock state appears to have resolved with complete resolution of abdominal pain, afebrile state, and downtrending leukocytosis now that steroids have stopped. We were concern that the ongoing vasoconstriction with pressors was contributing to elevated lactic acid.   Now that vassopressors have been stopped lactic acid has once again cleared to 1.3.   To better assess hemodynamics overnight a axillary A-line was placed with observed MAPs low to mid 60's   Plan to check [facility] stims test in am   Grace Haggart D. Harris, NP-C [place] [facility] Personal contact information can be found on Amion  If no contact or response made please call [phone] 05/22/2023, 4:19 PM

## 2023-05-22 NOTE — Progress Notes (Signed)
 [name], MRN:  [id number], DOB:  Jun 19, 1990, LOS: 4 ADMISSION DATE:  05/17/2023, CONSULTATION DATE:  05/17/2023 REFERRING MD:  Doran Durand - EDP, CHIEF COMPLAINT: Abdominal pain   History of Present Illness:  Amanda Fischer is a 33 y.o. F G4P3003 with PMHx significant for depression who is [redacted] weeks pregnant and presents to the ED with over one week of severe RLQ abdominal pain and emesis.  She has not established with OB for this pregnancy yet.  She speaks some English, but her husband reports that she has been able to tolerate very little by mouth.   In the ED her lactic acid was elevated to 2.8, no UTI, WBC 11.4, Na 134, K 3.4, Creatinine 0.68.  She underwent a transvaginal US which showed a 12week intrauterine pregnancy.  Given her RLQ pain a CT abd/pelvis was obtained showing peri- cecal inflammatory fat stranding inferior to the cecal tip which appears adjacent to  the appendix and is most in keeping with changes of epiploic appendagitis.  She was given Unasyn and 1L IVF, however required norepinephrine to maintain MAP > 65, so PCCM consulted for admission.  Pertinent Medical History:   Past Medical History:  Diagnosis Date   Depression    PPD   Headache    Hypotension    Significant Hospital Events: Including procedures, antibiotic start and stop dates in addition to other pertinent events   3/29 Admit to [facility] 3/30 No acute events overnight, abdominal pain remains resolved.  Remains on 1 mcg of Levophed 3/31 Emesis overnight. Reports abdominal pain this am. Will wean off levophed today. Complete Echo showed EF 55-60% ; Normal LV/RV function.  4/1 Levophed stopped.  4/2 Levo resumed yesterday and remains on a 3 due to drop in pressure associated with decreased mentation   Interim History / Subjective:  Seen lying in bed with no acute complaints, denies any ABD pain   Objective:   Blood pressure (!) 81/59, pulse 72, temperature 97.6 F (36.4 C), temperature source Oral, resp.  rate 20, height 5\' 1"  (1.549 m), weight 49.1 kg, SpO2 100%, unknown if currently breastfeeding.        Intake/Output Summary (Last 24 hours) at 05/22/2023 0803 Last data filed at 05/22/2023 0600 Gross per 24 hour  Intake 2085.85 ml  Output 2650 ml  Net -564.15 ml   Filed Weights   05/17/23 1845 05/18/23 0500 05/19/23 0500  Weight: 49.9 kg 53.4 kg 49.1 kg   Physical Examination (completed with assistance of video interpreter): General:: Well-appearing thin female adult female sitting up in bed in no acute distress HEENT: [place]/AT, MM pink/moist, PERRL,  Neuro: Alert and oriented x 3, language barrier CV: s1s2 regular rate and rhythm, no murmur, rubs, or gallops,  PULM: Clear to auscultation bilaterally, no increased work of breathing, no added breath sounds GI: soft, bowel sounds active in all 4 quadrants, non-tender, non-distended, tolerating oral diet Extremities: warm/dry, no edema  Skin: no rashes or lesions  Resolved Hospital Problem List:  Hypokalemia  Assessment & Plan:  Hypovolemic versus septic shock - improving Leukocytosis, increasing -3/31 Echo showed EF 55-60%, Normal  RV/LV function.  -Initially patient presented with signs of hypovolemic shock with positive lactic acid likely secondary to poor oral intake and vomiting. -She was able to tolerate an oral diet 4/1AM - Cortisol 5.0; DC'd Hydrocortisone after 2 doses -Looking back at her medical chart with it appears her baseline BP runs mid to low 80's to low 50's with MAP 60-65. Wonder  if she has dropper he BP lower with current early pregnancy due to elevating levels of progesterone  P: Will verbally consult OB to see if this could just be a normal physiologic response to pregnancy  Continue Levo for now  Remains on Midodrine, hoping to stop prior to D/C  Check free T3 and T4 Remains on Unasyn will discuss with attending utility to stop   Epiploic appendagitis with subsequent severe abdominal pain and vomiting -Epiploic  appendagitis is self-limiting -RVP/Urine Culture negative. 3/31 EKG QTc: 412. -Blood cultures remain negative to date P: Stop Unasyn   Protein calorie malnutrition Hypoalbuminemia P: Encourage oral intake  Discuss with OB ability to give supplemental albumin   Pregnancy status -3/31 D/w Dr. Donavan Foil (on-call OB-GYN) re: patient's pregnancy status; Dr. Donavan Foil reviewed TVUS and CT A/P. In his opinion, no significant contributions of pregnancy to patient's current abdominal discomfort/shock picture. --Looking back at her medical chart with it appears her baseline BP runs mid to low 80's to low 50's with MAP 60-65. Wonder if she has dropper he BP lower with current early pregnancy due to elevating levels of progesterone  P: Will discuss clinical picture with patients primary OB  Best Practice (right click and "Reselect all SmartList Selections" daily)   Diet/type: Regular consistency (see orders) DVT prophylaxis SCD Pressure ulcer(s): N/A GI prophylaxis: N/A Lines: N/A Foley:  N/A Code Status:  full code Last date of multidisciplinary goals of care discussion [husband and pt updated together at the bedside 4/1]  Critical care time:    CRITICAL CARE Performed by: Meagan Spease D. Harris   Total critical care time: 38 minutes  Critical care time was exclusive of separately billable procedures and treating other patients.  Critical care was necessary to treat or prevent imminent or life-threatening deterioration.  Critical care was time spent personally by me on the following activities: development of treatment plan with patient and/or surrogate as well as nursing, discussions with consultants, evaluation of patient's response to treatment, examination of patient, obtaining history from patient or surrogate, ordering and performing treatments and interventions, ordering and review of laboratory studies, ordering and review of radiographic studies, pulse oximetry and re-evaluation of patient's  condition.  Lakota Markgraf D. Harris, NP-C  Pulmonary & Critical Care Personal contact information can be found on Amion  If no contact or response made please call [phone] 05/22/2023, 8:29 AM

## 2023-05-22 NOTE — Procedures (Signed)
 Arterial Catheter Insertion Procedure Note  Amanda Fischer  [id number]  07-04-1990  Date:05/22/23  Time:5:03 PM    Provider Performing: Shelby Mattocks    Procedure: Insertion of Arterial Line (78295) with US guidance (62130)   Indication(s) Blood pressure monitoring and/or need for frequent ABGs  Consent Risks of the procedure as well as the alternatives and risks of each were explained to the patient and/or caregiver.  Consent for the procedure was obtained and is signed in the bedside chart  Anesthesia lidocaine   Time Out Verified patient identification, verified procedure, site/side was marked, verified correct patient position, special equipment/implants available, medications/allergies/relevant history reviewed, required imaging and test results available.   Sterile Technique Maximal sterile technique including full sterile barrier drape, hand hygiene, sterile gown, sterile gloves, mask, hair covering, sterile ultrasound probe cover (if used).   Procedure Description Area of catheter insertion was cleaned with chlorhexidine and draped in sterile fashion. With real-time ultrasound guidance an arterial catheter was placed into the left  axillary  artery.  Appropriate arterial tracings confirmed on monitor.     Complications/Tolerance None; patient tolerated the procedure well.   EBL Minimal   Specimen(s) None

## 2023-05-23 LAB — BASIC METABOLIC PANEL WITH GFR
Anion gap: 10 (ref 5–15)
BUN: 9 mg/dL (ref 6–20)
CO2: 19 mmol/L — ABNORMAL LOW (ref 22–32)
Calcium: 8.7 mg/dL — ABNORMAL LOW (ref 8.9–10.3)
Chloride: 104 mmol/L (ref 98–111)
Creatinine, Ser: 0.76 mg/dL (ref 0.44–1.00)
GFR, Estimated: >60 mL/min (ref >60)
Glucose, Bld: 202 mg/dL — ABNORMAL HIGH (ref 70–99)
Potassium: 3.2 mmol/L — ABNORMAL LOW (ref 3.5–5.1)
Sodium: 133 mmol/L — ABNORMAL LOW (ref 135–145)

## 2023-05-23 LAB — CBC WITH DIFFERENTIAL/PLATELET
Abs Immature Granulocytes: 0.08 10*3/uL — ABNORMAL HIGH (ref 0.00–0.07)
Basophils Absolute: 0 10*3/uL (ref 0.0–0.1)
Basophils Relative: 0 %
Eosinophils Absolute: 0 10*3/uL (ref 0.0–0.5)
Eosinophils Relative: 0 %
HCT: 30.2 % — ABNORMAL LOW (ref 36.0–46.0)
Hemoglobin: 10.7 g/dL — ABNORMAL LOW (ref 12.0–15.0)
Immature Granulocytes: 1 %
Lymphocytes Relative: 9 %
Lymphs Abs: 1.3 10*3/uL (ref 0.7–4.0)
MCH: 28 pg (ref 26.0–34.0)
MCHC: 35.4 g/dL (ref 30.0–36.0)
MCV: 79.1 fL — ABNORMAL LOW (ref 80.0–100.0)
Monocytes Absolute: 0.4 10*3/uL (ref 0.1–1.0)
Monocytes Relative: 2 %
Neutro Abs: 13.6 10*3/uL — ABNORMAL HIGH (ref 1.7–7.7)
Neutrophils Relative %: 88 %
Platelets: 295 10*3/uL (ref 150–400)
RBC: 3.82 MIL/uL — ABNORMAL LOW (ref 3.87–5.11)
RDW: 13.7 % (ref 11.5–15.5)
WBC: 15.3 10*3/uL — ABNORMAL HIGH (ref 4.0–10.5)
nRBC: 0 % (ref 0.0–0.2)

## 2023-05-23 LAB — ACTH STIMULATION, 3 TIME POINTS
Cortisol, 30 Min: 19.7 ug/dL
Cortisol, 60 Min: 22.9 ug/dL
Cortisol, Base: 1.1 ug/dL

## 2023-05-23 LAB — ACTH: C206 ACTH: <1.5 pg/mL — ABNORMAL LOW (ref 7.2–63.3)

## 2023-05-23 LAB — PROLACTIN: Prolactin: 46.1 ng/mL — ABNORMAL HIGH (ref 4.8–33.4)

## 2023-05-23 LAB — T4, FREE: Free T4: 0.77 ng/dL (ref 0.61–1.12)

## 2023-05-23 LAB — T3, FREE: T3, Free: 2.4 pg/mL (ref 2.0–4.4)

## 2023-05-23 LAB — GLUCOSE, CAPILLARY: Glucose-Capillary: 179 mg/dL — ABNORMAL HIGH (ref 70–99)

## 2023-05-23 MED ORDER — INFLUENZA VIRUS VACC SPLIT PF (FLUZONE) 0.5 ML IM SUSY
0.5000 mL | PREFILLED_SYRINGE | INTRAMUSCULAR | Status: AC
  Administered 2023-05-23: 0.5 mL via INTRAMUSCULAR
  Filled 2023-05-23: qty 0.5

## 2023-05-23 MED ORDER — INFLUENZA VIRUS VACC SPLIT PF (FLUZONE) 0.5 ML IM SUSY: 0.5000 mL | PREFILLED_SYRINGE | INTRAMUSCULAR | Status: DC

## 2023-05-23 MED ORDER — POTASSIUM CHLORIDE CRYS ER 20 MEQ PO TBCR
40.0000 meq | EXTENDED_RELEASE_TABLET | ORAL | Status: AC
  Administered 2023-05-23 (×2): 40 meq via ORAL
  Filled 2023-05-23 (×2): qty 2

## 2023-05-23 NOTE — Progress Notes (Signed)
 Pt discharged home with husband. AVS provided to pt, vietnamese interpret used to go over instructions all questions at this time

## 2023-05-23 NOTE — Progress Notes (Signed)
 [facility] ADULT ICU REPLACEMENT PROTOCOL   The patient does apply for the [facility] Adult ICU Electrolyte Replacment Protocol based on the criteria listed below:   1.Exclusion criteria: TCTS, ECMO, Dialysis, and Myasthenia Gravis patients 2. Is GFR >/= 30 ml/min? Yes.    Patient's GFR today is >60 3. Is SCr </= 2? Yes.   Patient's SCr is 0.76 mg/dL 4. Did SCr increase >/= 0.5 in 24 hours? No. 5.Pt's weight >40kg  Yes.   6. Abnormal electrolyte(s):   K 3.2 7. Electrolytes replaced per protocol 8.  Call MD STAT for K+ </= 2.5, Phos </= 1, or Mag </= 1 Physician:  Mikeal Hawthorne R Arleigh Dicola 05/23/2023 5:32 AM

## 2023-05-23 NOTE — Progress Notes (Signed)
 Called to [facility] to assess FHR prior to discharge. FHR 150 via ultrasound. + fetal movement. Dr Alvester Morin updated

## 2023-05-23 NOTE — TOC Transition Note (Signed)
 Transition of Care [facility]) - Discharge Note   Patient Details  Name: Amanda Fischer MRN: [id number] Date of Birth: [birth year]  Transition of Care [facility]) CM/SW Contact:  Tom-Johnson, Hershal Coria, RN Phone Number: 05/23/2023, 10:29 AM   Clinical Narrative:     Patient is scheduled for discharge today.  Readmission Risk Assessment done. New patient establishment, hospital f/u and discharge instructions on AVS. Referral sent to Financial Counseling for Medicaid Assistance.  Housing and Food resources on AVS. Husband, Fayrene Fearing to transport at discharge.  No further TOC needs noted.       Final next level of care: Home/Self Care Barriers to Discharge: Barriers Resolved   Patient Goals and CMS Choice Patient states their goals for this hospitalization and ongoing recovery are:: To return home CMS Medicare.gov Compare Post Acute Care list provided to:: Patient Choice offered to / list presented to : Patient, Spouse      Discharge Placement                Patient to be transferred to facility by: Husband Name of family member notified: Fayrene Fearing    Discharge Plan and Services Additional resources added to the After Visit Summary for   In-house Referral: PCP / [facility], Artist (Medicaid assistance.) Discharge Planning Services: CM Consult, Follow-up appt scheduled, Other - See comment (Food/Housing.)            DME Arranged: N/A DME Agency: NA       HH Arranged: NA HH Agency: NA        Social Drivers of Health (SDOH) Interventions SDOH Screenings   Food Insecurity: Food Insecurity Present (05/23/2023)  Housing: High Risk (05/23/2023)  Transportation Needs: Unmet Transportation Needs (05/23/2023)  Utilities: At Risk (05/23/2023)  Social Connections: Unknown (07/01/2021)   Received from [facility]  Tobacco Use: Low Risk  (05/17/2023)     Readmission Risk Interventions    05/20/2023    1:17 PM  Readmission Risk Prevention Plan  Post Dischage Appt  Complete  Medication Screening Complete  Transportation Screening Complete

## 2023-05-23 NOTE — Plan of Care (Signed)

## 2023-05-23 NOTE — Evaluation (Signed)
 Physical Therapy Evaluation/ Discharge Patient Details Name: Amanda Fischer MRN: [id number] DOB: 10-Dec-1990 Today's Date: 05/23/2023  History of Present Illness  33 yo female admitted 3/28 ([redacted] weeks pregnant) with abdominal pain, emesis, hypotension, appendicitis. No significant PMHx  Clinical Impression  Pt pleasant with interpreter utilized throughout and pt states independence at home, local driver and reports not moving much for the last week. Pt able to complete basic transfers, pericare, walking and stairs without physical assist. Pt with family able to assist at D/C with increased mobility encouraged. No further acute therapy needs with pt aware and agreeable. Will sign off.     BP 99/62 (76) HR 88-103      If plan is discharge home, recommend the following:     Can travel by private vehicle        Equipment Recommendations None recommended by PT  Recommendations for Other Services       Functional Status Assessment Patient has not had a recent decline in their functional status     Precautions / Restrictions Precautions Precautions: Other (comment) Recall of Precautions/Restrictions: Intact Precaution/Restrictions Comments: watch bp      Mobility  Bed Mobility Overal bed mobility: Needs Assistance Bed Mobility: Supine to Sit     Supine to sit: Supervision     General bed mobility comments: supervision for lines    Transfers Overall transfer level: Needs assistance   Transfers: Sit to/from Stand Sit to Stand: Supervision           General transfer comment: supervision for lines and safety with low bp    Ambulation/Gait Ambulation/Gait assistance: Supervision Gait Distance (Feet): 200 Feet Assistive device: None Gait Pattern/deviations: WFL(Within Functional Limits)   Gait velocity interpretation: >2.62 ft/sec, indicative of community ambulatory   General Gait Details: supervision for lines only, no LOB and cues for direction  Stairs Stairs:  Yes Stairs assistance: Supervision Stair Management: Alternating pattern, Forwards Number of Stairs: 9 General stair comments: with and without rail, improved safety and stability with rail and encouraged use  Wheelchair Mobility     Tilt Bed    Modified Rankin (Stroke Patients Only)       Balance Overall balance assessment: No apparent balance deficits (not formally assessed)                                           Pertinent Vitals/Pain Pain Assessment Pain Assessment: No/denies pain    Home Living Family/patient expects to be discharged to:: Private residence Living Arrangements: Spouse/significant other Available Help at Discharge: Family;Available 24 hours/day Type of Home: House Home Access: Stairs to enter     Alternate Level Stairs-Number of Steps: 14 Home Layout: Two level;Bed/bath upstairs Home Equipment: None      Prior Function Prior Level of Function : Independent/Modified Independent;Driving                     Extremity/Trunk Assessment   Upper Extremity Assessment Upper Extremity Assessment: Overall WFL for tasks assessed    Lower Extremity Assessment Lower Extremity Assessment: Overall WFL for tasks assessed    Cervical / Trunk Assessment Cervical / Trunk Assessment: Normal  Communication   Communication Communication: No apparent difficulties Factors Affecting Communication: Non - English speaking, interpreter not available    Cognition Arousal: Alert Behavior During Therapy: WFL for tasks assessed/performed   PT - Cognitive impairments: No apparent impairments,  Difficult to assess Difficult to assess due to: Non-English speaking                     PT - Cognition Comments: Interpreter Trula Ore [phone] utilized throughout with pt appropriately answering questions and responding to commands         Cueing Cueing Techniques: Verbal cues     General Comments      Exercises      Assessment/Plan    PT Assessment Patient does not need any further PT services  PT Problem List         PT Treatment Interventions      PT Goals (Current goals can be found in the Care Plan section)  Acute Rehab PT Goals PT Goal Formulation: All assessment and education complete, DC therapy    Frequency       Co-evaluation               AM-PAC PT "6 Clicks" Mobility  Outcome Measure Help needed turning from your back to your side while in a flat bed without using bedrails?: None Help needed moving from lying on your back to sitting on the side of a flat bed without using bedrails?: A Little Help needed moving to and from a bed to a chair (including a wheelchair)?: A Little Help needed standing up from a chair using your arms (e.g., wheelchair or bedside chair)?: A Little Help needed to walk in hospital room?: A Little Help needed climbing 3-5 steps with a railing? : A Little 6 Click Score: 19    End of Session   Activity Tolerance: Patient tolerated treatment well Patient left: in chair;with call bell/phone within reach Nurse Communication: Mobility status PT Visit Diagnosis: Other abnormalities of gait and mobility (R26.89)    Time: 0102-7253 PT Time Calculation (min) (ACUTE ONLY): 28 min   Charges:   PT Evaluation $PT Eval Low Complexity: 1 Low   PT General Charges $$ ACUTE PT VISIT: 1 Visit         Merryl Hacker, PT Acute Rehabilitation Services Office: [phone]   Enedina Finner Khristine Verno 05/23/2023, 9:50 AM

## 2023-05-23 NOTE — Discharge Instructions (Signed)

## 2023-05-23 NOTE — Progress Notes (Signed)
 eLink Physician-Brief Progress Note Patient Name: Amanda Fischer DOB: May 06, 1990 MRN: [id number]   Date of Service  05/23/2023  HPI/Events of Note  AM labs request  eICU Interventions  Reviewed, BMP, CBC ordered     Intervention Category Minor Interventions: Other:  Ranee Gosselin 05/23/2023, 3:08 AM

## 2023-05-23 NOTE — Progress Notes (Signed)
 OT Cancellation Note  Patient Details Name: Amanda Fischer MRN: [id number] DOB: 1990/02/25   Cancelled Treatment:    Reason Eval/Treat Not Completed: OT screened, no needs identified, will sign off Per PT, pt at independent baseline for ADLs/mobility. No formal OT eval needed at this time.  Lorre Munroe 05/23/2023, 9:02 AM

## 2023-07-02 LAB — HEPATITIS C ANTIBODY: HCV Ab: NEGATIVE

## 2023-07-02 LAB — OB RESULTS CONSOLE HEPATITIS B SURFACE ANTIGEN: Hepatitis B Surface Ag: NEGATIVE

## 2023-07-02 LAB — OB RESULTS CONSOLE RUBELLA ANTIBODY, IGM: Rubella: NON-IMMUNE/NOT IMMUNE

## 2023-07-03 ENCOUNTER — Ambulatory Visit: Payer: Self-pay | Admitting: Nurse Practitioner

## 2023-10-29 LAB — OB RESULTS CONSOLE GBS: GBS: NEGATIVE

## 2023-11-26 ENCOUNTER — Inpatient Hospital Stay (HOSPITAL_COMMUNITY): Admitting: Anesthesiology

## 2023-11-26 ENCOUNTER — Encounter (HOSPITAL_COMMUNITY): Payer: Self-pay

## 2023-11-26 ENCOUNTER — Inpatient Hospital Stay (HOSPITAL_COMMUNITY)
Admission: AD | Admit: 2023-11-26 | Discharge: 2023-11-29 | DRG: 787 | Disposition: A | Discharge status: Home / Self Care | Attending: Obstetrics and Gynecology | Admitting: Obstetrics and Gynecology

## 2023-11-26 ENCOUNTER — Other Ambulatory Visit: Payer: Self-pay

## 2023-11-26 DIAGNOSIS — O3663X0 Maternal care for excessive fetal growth, third trimester, not applicable or unspecified: Secondary | ICD-10-CM | POA: Diagnosis present

## 2023-11-26 DIAGNOSIS — D62 Acute posthemorrhagic anemia: Secondary | ICD-10-CM | POA: Diagnosis not present

## 2023-11-26 DIAGNOSIS — Z603 Acculturation difficulty: Secondary | ICD-10-CM | POA: Diagnosis present

## 2023-11-26 DIAGNOSIS — O4292 Full-term premature rupture of membranes, unspecified as to length of time between rupture and onset of labor: Secondary | ICD-10-CM | POA: Diagnosis present

## 2023-11-26 DIAGNOSIS — O0933 Supervision of pregnancy with insufficient antenatal care, third trimester: Secondary | ICD-10-CM | POA: Diagnosis not present

## 2023-11-26 DIAGNOSIS — O9081 Anemia of the puerperium: Secondary | ICD-10-CM | POA: Diagnosis not present

## 2023-11-26 DIAGNOSIS — Z3A39 39 weeks gestation of pregnancy: Secondary | ICD-10-CM | POA: Diagnosis not present

## 2023-11-26 DIAGNOSIS — O26893 Other specified pregnancy related conditions, third trimester: Secondary | ICD-10-CM | POA: Diagnosis present

## 2023-11-26 DIAGNOSIS — O479 False labor, unspecified: Secondary | ICD-10-CM

## 2023-11-26 DIAGNOSIS — Z3490 Encounter for supervision of normal pregnancy, unspecified, unspecified trimester: Secondary | ICD-10-CM

## 2023-11-26 LAB — URINALYSIS, ROUTINE W REFLEX MICROSCOPIC
Bacteria, UA: NONE SEEN
Bilirubin Urine: NEGATIVE
Glucose, UA: 50 mg/dL — AB
Ketones, ur: NEGATIVE mg/dL
Leukocytes,Ua: NEGATIVE
Nitrite: NEGATIVE
Protein, ur: 30 mg/dL — AB
Specific Gravity, Urine: 1.015 (ref 1.005–1.030)
pH: 6 (ref 5.0–8.0)

## 2023-11-26 LAB — CBC
HCT: 33.3 % — ABNORMAL LOW (ref 36.0–46.0)
HCT: 30.8 % — ABNORMAL LOW (ref 36.0–46.0)
Hemoglobin: 10.2 g/dL — ABNORMAL LOW (ref 12.0–15.0)
Hemoglobin: 9.5 g/dL — ABNORMAL LOW (ref 12.0–15.0)
MCH: 20.6 pg — ABNORMAL LOW (ref 26.0–34.0)
MCH: 20.4 pg — ABNORMAL LOW (ref 26.0–34.0)
MCHC: 30.6 g/dL (ref 30.0–36.0)
MCHC: 30.8 g/dL (ref 30.0–36.0)
MCV: 67.1 fL — ABNORMAL LOW (ref 80.0–100.0)
MCV: 66.2 fL — ABNORMAL LOW (ref 80.0–100.0)
Platelets: UNDETERMINED K/uL (ref 150–400)
Platelets: 323 K/uL (ref 150–400)
RBC: 4.96 MIL/uL (ref 3.87–5.11)
RBC: 4.65 MIL/uL (ref 3.87–5.11)
RDW: 20.5 % — ABNORMAL HIGH (ref 11.5–15.5)
RDW: 20.4 % — ABNORMAL HIGH (ref 11.5–15.5)
WBC: 8.5 K/uL (ref 4.0–10.5)
WBC: 8.1 K/uL (ref 4.0–10.5)
nRBC: 0.4 % — ABNORMAL HIGH (ref 0.0–0.2)
nRBC: 0.5 % — ABNORMAL HIGH (ref 0.0–0.2)

## 2023-11-26 LAB — POCT FERN TEST: POCT Fern Test: NEGATIVE

## 2023-11-26 LAB — RUPTURE OF MEMBRANE (ROM)PLUS: Rom Plus: POSITIVE

## 2023-11-26 MED ORDER — PHENYLEPHRINE 80 MCG/ML (10ML) SYRINGE FOR IV PUSH (FOR BLOOD PRESSURE SUPPORT): 80.0000 ug | PREFILLED_SYRINGE | INTRAVENOUS | Status: DC | PRN

## 2023-11-26 MED ORDER — OXYTOCIN-SODIUM CHLORIDE 30-0.9 UT/500ML-% IV SOLN: 2.5000 [IU]/h | INTRAVENOUS | Status: DC

## 2023-11-26 MED ORDER — HYDROXYZINE HCL 50 MG PO TABS: 50.0000 mg | ORAL_TABLET | Freq: Four times a day (QID) | ORAL | Status: DC | PRN

## 2023-11-26 MED ORDER — OXYTOCIN-SODIUM CHLORIDE 30-0.9 UT/500ML-% IV SOLN: 1.0000 m[IU]/min | INTRAVENOUS | Status: DC

## 2023-11-26 MED ORDER — OXYTOCIN BOLUS FROM INFUSION
333.0000 mL | Freq: Once | INTRAVENOUS | Status: DC
Start: 2023-11-26 — End: 2023-11-27

## 2023-11-26 MED ORDER — LIDOCAINE HCL (PF) 1 % IJ SOLN
INTRAMUSCULAR | Status: DC | PRN
  Administered 2023-11-26: 11 mL via EPIDURAL

## 2023-11-26 MED ORDER — FENTANYL CITRATE (PF) 100 MCG/2ML IJ SOLN: 50.0000 ug | INTRAMUSCULAR | Status: DC | PRN

## 2023-11-26 MED ORDER — ACETAMINOPHEN 325 MG PO TABS: 650.0000 mg | ORAL_TABLET | ORAL | Status: DC | PRN

## 2023-11-26 MED ORDER — ONDANSETRON HCL 4 MG/2ML IJ SOLN: 4.0000 mg | Freq: Four times a day (QID) | INTRAMUSCULAR | Status: DC | PRN

## 2023-11-26 MED ORDER — SOD CITRATE-CITRIC ACID 500-334 MG/5ML PO SOLN
30.0000 mL | ORAL | Status: DC | PRN
  Administered 2023-11-27: 30 mL via ORAL
  Filled 2023-11-26: qty 30

## 2023-11-26 MED ORDER — TERBUTALINE SULFATE 1 MG/ML IJ SOLN
0.2500 mg | Freq: Once | INTRAMUSCULAR | Status: DC | PRN
  Filled 2023-11-26: qty 1

## 2023-11-26 MED ORDER — LACTATED RINGERS IV SOLN: INTRAVENOUS | Status: DC

## 2023-11-26 MED ORDER — MISOPROSTOL 25 MCG QUARTER TABLET: 25.0000 ug | ORAL_TABLET | ORAL | Status: DC | PRN

## 2023-11-26 MED ORDER — EPHEDRINE 5 MG/ML INJ: 10.0000 mg | INTRAVENOUS | Status: DC | PRN

## 2023-11-26 MED ORDER — LACTATED RINGERS IV SOLN: 500.0000 mL | INTRAVENOUS | Status: DC | PRN

## 2023-11-26 MED ORDER — LACTATED RINGERS AMNIOINFUSION: INTRAVENOUS | Status: DC

## 2023-11-26 MED ORDER — PHENYLEPHRINE 80 MCG/ML (10ML) SYRINGE FOR IV PUSH (FOR BLOOD PRESSURE SUPPORT)
80.0000 ug | PREFILLED_SYRINGE | INTRAVENOUS | Status: AC | PRN
  Administered 2023-11-27 (×2): 80 ug via INTRAVENOUS
  Filled 2023-11-26 (×2): qty 10

## 2023-11-26 MED ORDER — FENTANYL-BUPIVACAINE-NACL 0.5-0.125-0.9 MG/250ML-% EP SOLN
12.0000 mL/h | EPIDURAL | Status: DC | PRN
  Administered 2023-11-26: 12 mL/h via EPIDURAL
  Filled 2023-11-26: qty 250

## 2023-11-26 MED ORDER — LACTATED RINGERS IV SOLN: 500.0000 mL | Freq: Once | INTRAVENOUS | Status: DC

## 2023-11-26 MED ORDER — LIDOCAINE HCL (PF) 1 % IJ SOLN: 30.0000 mL | INTRAMUSCULAR | Status: DC | PRN

## 2023-11-26 MED ORDER — DIPHENHYDRAMINE HCL 50 MG/ML IJ SOLN: 12.5000 mg | INTRAMUSCULAR | Status: DC | PRN

## 2023-11-26 MED ORDER — OXYTOCIN-SODIUM CHLORIDE 30-0.9 UT/500ML-% IV SOLN
INTRAVENOUS | Status: AC
  Administered 2023-11-26: 2 m[IU]/min via INTRAVENOUS
  Filled 2023-11-26: qty 500

## 2023-11-26 NOTE — MAU Note (Addendum)
 Amanda Fischer is a 33 y.o. at [redacted]w[redacted]d here in MAU reporting: ctxs that started last night that are every 8-10 minutes this am. Denies any LOF or VB. Reports +FM Reporting increase in urinary frequency and burning that started 1 week ago. Reports constant pelvic pressure, it hurts to walk or sleep.  Video interpreter Juliana [phone] used for triage. Husband speaks english and pt is ok for husband to translate for her. Interpreter paper signed.   Onset of complaint: ongoing Pain score: 6 Vitals:   11/26/23 1040  BP: 97/62  Pulse: (!) 108  Resp: 19  Temp: 98.7 F (37.1 C)  SpO2: 100%     FHT:145 Lab orders placed from triage:  labor eval, UA

## 2023-11-26 NOTE — MAU Provider Note (Addendum)
 Chief Complaint:  Contractions and Pelvic Pain   None     HPI: Amanda Fischer is a 33 y.o. H5E6996 at [redacted]w[redacted]d who presents w/ her husband to maternity admissions reporting contractions lasting 3-4 minutes every 5-10 minutes since midnight. She reports significant lower abdominal pressure and discomfort, radiating to her back making it feel like she needs to poop w/o being able to. She has noticed increased loss of fluid over multiple days, with the most fluid over the last 2 days. She endorses slimy, bloody appearing vaginal discharge this AM x1 as well as continued leakage of clear fluid throughout the early morning, though she is unsure if it has an odor. She denies noting a single large gush of fluid.  She endorses HA, dizziness and some blurry vision this AM. She also endorses multiple days of burning and pain w/ urination, believes there was one episode of blood in her urine a few days ago but not completely sure.   She reports good fetal movement, denies vaginal itching/burning, n/v, and fever/chills.    She denies any pregnancy complications, takes no daily medications, and only medical hx provided was a hospitalization in the spring for epiploic appendagitis and hypovolemic shock.   Past Medical History: Past Medical History:  Diagnosis Date   Depression    PPD   Headache    Hypotension     Past obstetric history: OB History  Gravida Para Term Preterm AB Living  4 3 3   3   SAB IAB Ectopic Multiple Live Births     0 3    # Outcome Date GA Lbr Len/2nd Weight Sex Type Anes PTL Lv  4 Current           3 Term 12/04/19 [redacted]w[redacted]d 07:00 / 00:06 3065 g F Vag-Spont Local  LIV     Birth Comments: Wnl   2 Term 04/23/16 [redacted]w[redacted]d 11:05 / 00:15 2795 g F Vag-Spont EPI  LIV  1 Term 12/02/13 [redacted]w[redacted]d / 00:19 3020 g F Vag-Spont None  LIV    Past Surgical History: Past Surgical History:  Procedure Laterality Date   TONSILLECTOMY      Family History: History reviewed. No pertinent family  history.  Social History: Social History   Tobacco Use   Smoking status: Never   Smokeless tobacco: Never  Vaping Use   Vaping status: Never Used  Substance Use Topics   Alcohol use: No   Drug use: No    Allergies: No Known Allergies  Meds:  Medications Prior to Admission  Medication Sig Dispense Refill Last Dose/Taking   acetaminophen  (TYLENOL ) 500 MG tablet Take 1,000 mg by mouth daily as needed for moderate pain (pain score 4-6).   Past Month   I have reviewed patient's Past Medical Hx, Surgical Hx, Family Hx, Social Hx, medications and allergies.   Physical Exam  Patient Vitals for the past 24 hrs:  BP Temp Temp src Pulse Resp SpO2  11/26/23 1040 97/62 98.7 F (37.1 C) Oral (!) 108 19 100 %   Constitutional: Well-developed, well-nourished female in no acute distress.  Cardiovascular: normal rate Respiratory: normal effort GI: Abd soft, non-tender, gravid appropriate for gestational age.  MS: Extremities nontender, no edema, normal ROM Neurologic: Alert and oriented x 4.  GU: Neg CVAT.  Dilation: 4 Effacement (%): 50 Cervical Position: Posterior Station: Ballotable Presentation: Vertex Exam by:: N. Dario, RN   Labs: Results for orders placed or performed during the hospital encounter of 11/26/23 (from the past 24 hours)  Urinalysis, Routine w reflex microscopic -Urine, Clean Catch     Status: Abnormal   Collection Time: 11/26/23 10:49 AM  Result Value Ref Range   Color, Urine YELLOW YELLOW   APPearance HAZY (A) CLEAR   Specific Gravity, Urine 1.015 1.005 - 1.030   pH 6.0 5.0 - 8.0   Glucose, UA 50 (A) NEGATIVE mg/dL   Hgb urine dipstick SMALL (A) NEGATIVE   Bilirubin Urine NEGATIVE NEGATIVE   Ketones, ur NEGATIVE NEGATIVE mg/dL   Protein, ur 30 (A) NEGATIVE mg/dL   Nitrite NEGATIVE NEGATIVE   Leukocytes,Ua NEGATIVE NEGATIVE   RBC / HPF 0-5 0 - 5 RBC/hpf   WBC, UA 0-5 0 - 5 WBC/hpf   Bacteria, UA NONE SEEN NONE SEEN   Squamous Epithelial / HPF  6-10 0 - 5 /HPF   Mucus PRESENT   Fern Test     Status: Normal   Collection Time: 11/26/23 12:15 PM  Result Value Ref Range   POCT Fern Test Negative = intact amniotic membranes   Rupture of Membrane (ROM) Plus     Status: None   Collection Time: 11/26/23  1:57 PM  Result Value Ref Range   Rom Plus POSITIVE       Imaging:  No results found.  MAU Course/MDM: Orders Placed This Encounter  Procedures   Urine Culture   Urinalysis, Routine w reflex microscopic -Urine, Clean Catch   Rupture of Membrane (ROM) Plus   Fern Test    No orders of the defined types were placed in this encounter.  No dilation change on serial cervical checks.   UA dirty but negative for UTI Fern negative, ROM plus POSITIVE  NST reviewed and reassuring w/ baseline ~140, moderate variability and accels present.  Contractions q ~5 minutes.    Assessment: 1. [redacted] weeks gestation of pregnancy   2. Uterine contractions at greater than 20 weeks of gestation     ROM at term w/ regular contractions.   Plan:  Admit to L&D  Leafy Scriver, DO  11/26/2023 2:48 PM

## 2023-11-26 NOTE — H&P (Addendum)
 Amanda Fischer is a 33 y.o. G57P3003 female presenting at 65 4/7 weeks. Pt was monitored in MAU for labor check with no cervical change noted over several hours. She then reported LOF over several days thus was tested and found positive for amniotic fluid with intact membranes. Pt admitted for labor mgmt.  Her pregnancy was complicated by  Sepsis - admitted for care prior to her first prenatal visit Late to care - first visit at 26wks Hemoglobin E trait She is GBS neg.  OB History     Gravida  4   Para  3   Term  3   Preterm      AB      Living  3      SAB      IAB      Ectopic      Multiple  0   Live Births  3          Past Medical History:  Diagnosis Date   Depression    PPD   Headache    Hypotension    Past Surgical History:  Procedure Laterality Date   TONSILLECTOMY     Family History: family history is not on file. Social History:  reports that she has never smoked. She has never used smokeless tobacco. She reports that she does not drink alcohol and does not use drugs.     Maternal Diabetes: No Genetic Screening: Declined Maternal Ultrasounds/Referrals: Normal Fetal Ultrasounds or other Referrals:  None Maternal Substance Abuse:  No Significant Maternal Medications:  None Significant Maternal Lab Results:  Group B Strep negative Number of Prenatal Visits:greater than 3 verified prenatal visits Maternal Vaccinations:TDap and Flu Other Comments:  None  Review of Systems  Constitutional:  Positive for activity change and fatigue.  Respiratory:  Negative for chest tightness and shortness of breath.   Cardiovascular:  Positive for leg swelling. Negative for chest pain and palpitations.  Gastrointestinal:  Positive for abdominal pain. Negative for nausea.  Genitourinary:  Positive for pelvic pain and vaginal discharge. Negative for vaginal bleeding.  Musculoskeletal:  Negative for back pain.  Neurological:  Negative for light-headedness and numbness.   Psychiatric/Behavioral:  The patient is not nervous/anxious.    Maternal Medical History:  Reason for admission: Rupture of membranes.  Nausea.  Contractions: Onset was 2 days ago.   Frequency: regular.   Perceived severity is moderate.   Fetal activity: Perceived fetal activity is normal.   Prenatal complications: no prenatal complications Prenatal Complications - Diabetes: none.   Dilation: 4 Effacement (%): 50 Station: Ballotable Exam by:: N. Dario, RN Blood pressure 97/62, pulse (!) 108, temperature 98.7 F (37.1 C), temperature source Oral, resp. rate 19, SpO2 100%, unknown if currently breastfeeding. Maternal Exam:  Uterine Assessment: Contraction strength is moderate.  Contraction frequency is regular.  Abdomen: Patient reports generalized tenderness.  Estimated fetal weight is AGA.   Fetal presentation: vertex Introitus: Normal vulva. Vulva is negative for condylomata and lesion.  Normal vagina.  Vagina is negative for condylomata.  Amniotic fluid character: clear. Pelvis: adequate for delivery.   Cervix: Cervix evaluated by digital exam.     Fetal Exam Fetal Monitor Review: Baseline rate: 140.  Variability: moderate (6-25 bpm).   Pattern: accelerations present and no decelerations.   Fetal State Assessment: Category I - tracings are normal.   Physical Exam Vitals and nursing note reviewed.  Constitutional:      Appearance: Normal appearance.  Cardiovascular:     Rate and Rhythm:  Normal rate.     Pulses: Normal pulses.  Pulmonary:     Effort: Pulmonary effort is normal.  Abdominal:     Tenderness: There is generalized abdominal tenderness.  Genitourinary:    General: Normal vulva.  Vulva is no lesion.  Musculoskeletal:        General: Normal range of motion.     Cervical back: Normal range of motion.  Skin:    General: Skin is warm and dry.     Capillary Refill: Capillary refill takes 2 to 3 seconds.  Neurological:     General: No focal  deficit present.     Mental Status: She is alert and oriented to person, place, and time. Mental status is at baseline.  Psychiatric:        Mood and Affect: Mood normal.        Behavior: Behavior normal.        Thought Content: Thought content normal.        Judgment: Judgment normal.     Prenatal labs: ABO, Rh:   Antibody:   Rubella:   RPR:    HBsAg:    HIV: Non Reactive (03/29 0122)  GBS:     Assessment/Plan: 68bn H5E6996 female at 11 4/7wks with suspected high leak for ROM with delayed labor - Admit  - GBS neg - Offer pain med options including epidural - AROM when able  - Pitocin  as indicated - Anticipate svd   Ted ORN Taitum Menton 11/26/2023, 3:17 PM

## 2023-11-26 NOTE — Anesthesia Preprocedure Evaluation (Addendum)
 Anesthesia Evaluation  Patient identified by MRN, date of birth, ID band Patient awake    Reviewed: Allergy & Precautions, Patient's Chart, lab work & pertinent test results  Airway Mallampati: II  TM Distance: >3 FB     Dental no notable dental hx. (+) Teeth Intact   Pulmonary neg pulmonary ROS   Pulmonary exam normal breath sounds clear to auscultation       Cardiovascular negative cardio ROS Normal cardiovascular exam Rhythm:Regular Rate:Normal     Neuro/Psych  Headaches PSYCHIATRIC DISORDERS  Depression       GI/Hepatic negative GI ROS, Neg liver ROS,,,  Endo/Other  negative endocrine ROS    Renal/GU negative Renal ROS  negative genitourinary   Musculoskeletal negative musculoskeletal ROS (+)    Abdominal   Peds  Hematology negative hematology ROS (+)   Anesthesia Other Findings   Reproductive/Obstetrics (+) Pregnancy                              Anesthesia Physical Anesthesia Plan  ASA: II  Anesthesia Plan: Epidural   Post-op Pain Management:    Induction:   PONV Risk Score and Plan:   Airway Management Planned: Natural Airway  Additional Equipment:   Intra-op Plan:   Post-operative Plan:   Informed Consent: I have reviewed the patients History and Physical, chart, labs and discussed the procedure including the risks, benefits and alternatives for the proposed anesthesia with the patient or authorized representative who has indicated his/her understanding and acceptance.       Plan Discussed with: Anesthesiologist  Anesthesia Plan Comments:          Anesthesia Quick Evaluation

## 2023-11-26 NOTE — Anesthesia Procedure Notes (Signed)
 Epidural Patient location during procedure: OB Start time: 11/26/2023 7:32 PM End time: 11/26/2023 7:46 PM  Staffing Anesthesiologist: Cleotilde Butler Dade, MD Performed: anesthesiologist   Preanesthetic Checklist Completed: patient identified, IV checked, site marked, risks and benefits discussed, surgical consent, monitors and equipment checked, pre-op evaluation and timeout performed  Epidural Patient position: sitting Prep: ChloraPrep Patient monitoring: heart rate, cardiac monitor, continuous pulse ox and blood pressure Approach: midline Injection technique: LOR saline  Needle:  Needle type: Tuohy  Needle gauge: 17 G Needle length: 9 cm Catheter type: closed end flexible Catheter size: 20 Guage Catheter at skin depth: 11 cm Test dose: negative  Assessment Events: blood not aspirated, injection not painful, no injection resistance, no paresthesia and negative IV test  Additional Notes Reason for block:procedure for pain

## 2023-11-26 NOTE — Progress Notes (Signed)
 Amanda Fischer is a 33 y.o. G4P3003 at [redacted]w[redacted]d  Subjective: Pt received epidural and reported relief. She does admit to pelvic pressure  Objective: BP 95/70   Pulse 95   Temp 98.7 F (37.1 C) (Oral)   Resp 16   LMP  (LMP Unknown)   SpO2 100%  No intake/output data recorded. No intake/output data recorded.  FHT:  FHR: 135 bpm, variability: moderate,  accelerations:  Present,  decelerations:  Absent UC:   regular, every 2-4 minutes SVE:   Dilation: 8 Effacement (%): 80 Station: -1 Exam by:: Amanda Fischer  Labs: Lab Results  Component Value Date   WBC 8.1 11/26/2023   HGB 9.5 (L) 11/26/2023   HCT 30.8 (L) 11/26/2023   MCV 66.2 (L) 11/26/2023   PLT 323 11/26/2023    Assessment / Plan: 33yo H5E6996 female at 74 4/7wks in active labor  Labor: progressing on pitocin . AROM performed with copious clear fluid return. Vertex confirmed.  Preeclampsia:  n/a Fetal Wellbeing:  Category I Pain Control:  Epidural I/D:  n/a Anticipated MOD:  NSVD  Amanda LELON Solo, DO 11/26/2023, 9:12 PM

## 2023-11-27 ENCOUNTER — Encounter (HOSPITAL_COMMUNITY): Payer: Self-pay

## 2023-11-27 ENCOUNTER — Encounter (HOSPITAL_COMMUNITY): Admission: AD | Disposition: A | Payer: Self-pay | Source: Home / Self Care | Attending: Obstetrics and Gynecology

## 2023-11-27 ENCOUNTER — Other Ambulatory Visit: Payer: Self-pay

## 2023-11-27 DIAGNOSIS — Z3A39 39 weeks gestation of pregnancy: Secondary | ICD-10-CM

## 2023-11-27 DIAGNOSIS — O3663X0 Maternal care for excessive fetal growth, third trimester, not applicable or unspecified: Secondary | ICD-10-CM | POA: Diagnosis not present

## 2023-11-27 DIAGNOSIS — O0933 Supervision of pregnancy with insufficient antenatal care, third trimester: Secondary | ICD-10-CM

## 2023-11-27 LAB — POCT I-STAT EG7
Acid-base deficit: 10 mmol/L — ABNORMAL HIGH (ref 0.0–2.0)
Bicarbonate: 16.8 mmol/L — ABNORMAL LOW (ref 20.0–28.0)
Calcium, Ion: 1.06 mmol/L — ABNORMAL LOW (ref 1.15–1.40)
HCT: 24 % — ABNORMAL LOW (ref 36.0–46.0)
Hemoglobin: 8.2 g/dL — ABNORMAL LOW (ref 12.0–15.0)
O2 Saturation: 16 %
Patient temperature: 36
Potassium: 4.5 mmol/L (ref 3.5–5.1)
Sodium: 136 mmol/L (ref 135–145)
TCO2: 18 mmol/L — ABNORMAL LOW (ref 22–32)
pCO2, Ven: 40 mmHg — ABNORMAL LOW (ref 44–60)
pH, Ven: 7.225 — ABNORMAL LOW (ref 7.25–7.43)
pO2, Ven: 15 mmHg — CL (ref 32–45)

## 2023-11-27 LAB — CBC WITH DIFFERENTIAL/PLATELET
Abs Immature Granulocytes: 0.14 K/uL — ABNORMAL HIGH (ref 0.00–0.07)
Basophils Absolute: 0.1 K/uL (ref 0.0–0.1)
Basophils Relative: 0 %
Eosinophils Absolute: 0 K/uL (ref 0.0–0.5)
Eosinophils Relative: 0 %
HCT: 26.8 % — ABNORMAL LOW (ref 36.0–46.0)
Hemoglobin: 9 g/dL — ABNORMAL LOW (ref 12.0–15.0)
Immature Granulocytes: 1 %
Lymphocytes Relative: 3 %
Lymphs Abs: 0.7 K/uL (ref 0.7–4.0)
MCH: 24.5 pg — ABNORMAL LOW (ref 26.0–34.0)
MCHC: 33.6 g/dL (ref 30.0–36.0)
MCV: 72.8 fL — ABNORMAL LOW (ref 80.0–100.0)
Monocytes Absolute: 1 K/uL (ref 0.1–1.0)
Monocytes Relative: 5 %
Neutro Abs: 20.5 K/uL — ABNORMAL HIGH (ref 1.7–7.7)
Neutrophils Relative %: 91 %
Platelets: 196 K/uL (ref 150–400)
RBC: 3.68 MIL/uL — ABNORMAL LOW (ref 3.87–5.11)
RDW: 21.4 % — ABNORMAL HIGH (ref 11.5–15.5)
Smear Review: NORMAL
WBC: 22.4 K/uL — ABNORMAL HIGH (ref 4.0–10.5)
nRBC: 0 % (ref 0.0–0.2)

## 2023-11-27 LAB — CBC
HCT: 26.4 % — ABNORMAL LOW (ref 36.0–46.0)
HCT: 28.2 % — ABNORMAL LOW (ref 36.0–46.0)
Hemoglobin: 7.9 g/dL — ABNORMAL LOW (ref 12.0–15.0)
Hemoglobin: 9.5 g/dL — ABNORMAL LOW (ref 12.0–15.0)
MCH: 20.7 pg — ABNORMAL LOW (ref 26.0–34.0)
MCH: 24.4 pg — ABNORMAL LOW (ref 26.0–34.0)
MCHC: 29.9 g/dL — ABNORMAL LOW (ref 30.0–36.0)
MCHC: 33.7 g/dL (ref 30.0–36.0)
MCV: 69.3 fL — ABNORMAL LOW (ref 80.0–100.0)
MCV: 72.5 fL — ABNORMAL LOW (ref 80.0–100.0)
Platelets: 389 K/uL (ref 150–400)
Platelets: 177 K/uL (ref 150–400)
RBC: 3.81 MIL/uL — ABNORMAL LOW (ref 3.87–5.11)
RBC: 3.89 MIL/uL (ref 3.87–5.11)
RDW: 20.7 % — ABNORMAL HIGH (ref 11.5–15.5)
RDW: 20.3 % — ABNORMAL HIGH (ref 11.5–15.5)
WBC: 25 K/uL — ABNORMAL HIGH (ref 4.0–10.5)
WBC: 18.2 K/uL — ABNORMAL HIGH (ref 4.0–10.5)
nRBC: 0 % (ref 0.0–0.2)
nRBC: 0.2 % (ref 0.0–0.2)

## 2023-11-27 LAB — PREPARE RBC (CROSSMATCH)

## 2023-11-27 LAB — URINE CULTURE: Culture: <10000 — AB

## 2023-11-27 LAB — POSTPARTUM HEMORRHAGE (BB NOTIFICATION)

## 2023-11-27 LAB — ABO/RH: ABO/RH(D): O POS

## 2023-11-27 LAB — RPR: RPR Ser Ql: NONREACTIVE

## 2023-11-27 SURGERY — Surgical Case
Anesthesia: Epidural

## 2023-11-27 MED ORDER — CEFAZOLIN SODIUM-DEXTROSE 2-4 GM/100ML-% IV SOLN
2.0000 g | INTRAVENOUS | Status: AC
  Administered 2023-11-27 (×2): 2 g via INTRAVENOUS

## 2023-11-27 MED ORDER — DIPHENHYDRAMINE HCL 50 MG/ML IJ SOLN
25.0000 mg | Freq: Once | INTRAMUSCULAR | Status: AC
  Administered 2023-11-27: 25 mg via INTRAVENOUS
  Filled 2023-11-27: qty 1

## 2023-11-27 MED ORDER — POVIDONE-IODINE 10 % EX SWAB: 2.0000 | Freq: Once | CUTANEOUS | Status: DC

## 2023-11-27 MED ORDER — STERILE WATER FOR IRRIGATION IR SOLN
Status: DC | PRN
  Administered 2023-11-27: 1000 mL

## 2023-11-27 MED ORDER — DEXTROSE 5 % IV SOLN: INTRAVENOUS | Status: DC | PRN

## 2023-11-27 MED ORDER — SODIUM CHLORIDE 0.9% IV SOLUTION: Freq: Once | INTRAVENOUS | Status: DC

## 2023-11-27 MED ORDER — PHENYLEPHRINE HCL-NACL 20-0.9 MG/250ML-% IV SOLN
INTRAVENOUS | Status: DC | PRN
  Administered 2023-11-27: 60 ug/min via INTRAVENOUS

## 2023-11-27 MED ORDER — OXYCODONE HCL 5 MG/5ML PO SOLN: 5.0000 mg | Freq: Once | ORAL | Status: DC | PRN

## 2023-11-27 MED ORDER — COCONUT OIL OIL: 1.0000 | TOPICAL_OIL | Status: DC | PRN

## 2023-11-27 MED ORDER — TRANEXAMIC ACID-NACL 1000-0.7 MG/100ML-% IV SOLN
INTRAVENOUS | Status: AC
  Filled 2023-11-27: qty 100

## 2023-11-27 MED ORDER — NALOXONE HCL 0.4 MG/ML IJ SOLN
0.4000 mg | INTRAMUSCULAR | Status: DC | PRN
Start: 2023-11-27 — End: 2023-11-29

## 2023-11-27 MED ORDER — SENNOSIDES-DOCUSATE SODIUM 8.6-50 MG PO TABS
2.0000 | ORAL_TABLET | Freq: Every day | ORAL | Status: DC
  Administered 2023-11-28 – 2023-11-29 (×2): 2 via ORAL
  Filled 2023-11-27 (×2): qty 2

## 2023-11-27 MED ORDER — OXYTOCIN-SODIUM CHLORIDE 30-0.9 UT/500ML-% IV SOLN
INTRAVENOUS | Status: AC
  Filled 2023-11-27: qty 500

## 2023-11-27 MED ORDER — IBUPROFEN 600 MG PO TABS
600.0000 mg | ORAL_TABLET | Freq: Four times a day (QID) | ORAL | Status: DC
  Administered 2023-11-27 – 2023-11-29 (×9): 600 mg via ORAL
  Filled 2023-11-27 (×10): qty 1

## 2023-11-27 MED ORDER — ACETAMINOPHEN 10 MG/ML IV SOLN
INTRAVENOUS | Status: AC
  Filled 2023-11-27: qty 100

## 2023-11-27 MED ORDER — LACTATED RINGERS IV SOLN: INTRAVENOUS | Status: DC

## 2023-11-27 MED ORDER — OXYCODONE HCL 5 MG PO TABS: 5.0000 mg | ORAL_TABLET | Freq: Once | ORAL | Status: DC | PRN

## 2023-11-27 MED ORDER — KETOROLAC TROMETHAMINE 30 MG/ML IJ SOLN: 30.0000 mg | Freq: Once | INTRAMUSCULAR | Status: DC | PRN

## 2023-11-27 MED ORDER — OXYCODONE HCL 5 MG PO TABS
5.0000 mg | ORAL_TABLET | ORAL | Status: DC | PRN
  Administered 2023-11-29 (×2): 5 mg via ORAL
  Filled 2023-11-27: qty 2
  Filled 2023-11-27 (×2): qty 1

## 2023-11-27 MED ORDER — ZOLPIDEM TARTRATE 5 MG PO TABS: 5.0000 mg | ORAL_TABLET | Freq: Every evening | ORAL | Status: DC | PRN

## 2023-11-27 MED ORDER — DIPHENHYDRAMINE HCL 25 MG PO CAPS: 25.0000 mg | ORAL_CAPSULE | ORAL | Status: DC | PRN

## 2023-11-27 MED ORDER — ONDANSETRON HCL 4 MG/2ML IJ SOLN
INTRAMUSCULAR | Status: AC
  Filled 2023-11-27: qty 2

## 2023-11-27 MED ORDER — DIPHENHYDRAMINE HCL 50 MG/ML IJ SOLN
25.0000 mg | Freq: Once | INTRAMUSCULAR | Status: AC
  Administered 2023-11-27: 25 mg via INTRAVENOUS

## 2023-11-27 MED ORDER — GABAPENTIN 100 MG PO CAPS
100.0000 mg | ORAL_CAPSULE | Freq: Two times a day (BID) | ORAL | Status: DC
  Administered 2023-11-27 – 2023-11-29 (×5): 100 mg via ORAL
  Filled 2023-11-27 (×5): qty 1

## 2023-11-27 MED ORDER — WITCH HAZEL-GLYCERIN EX PADS: 1.0000 | MEDICATED_PAD | CUTANEOUS | Status: DC | PRN

## 2023-11-27 MED ORDER — FERROUS SULFATE 325 (65 FE) MG PO TABS
325.0000 mg | ORAL_TABLET | ORAL | Status: DC
  Administered 2023-11-27 – 2023-11-29 (×2): 325 mg via ORAL
  Filled 2023-11-27 (×2): qty 1

## 2023-11-27 MED ORDER — METOCLOPRAMIDE HCL 5 MG/ML IJ SOLN
INTRAMUSCULAR | Status: DC | PRN
  Administered 2023-11-27: 10 mg via INTRAVENOUS

## 2023-11-27 MED ORDER — FENTANYL CITRATE (PF) 100 MCG/2ML IJ SOLN
INTRAMUSCULAR | Status: AC
  Filled 2023-11-27: qty 2

## 2023-11-27 MED ORDER — SODIUM CHLORIDE 0.9 % IR SOLN
Status: DC | PRN
  Administered 2023-11-27: 1

## 2023-11-27 MED ORDER — DIPHENHYDRAMINE HCL 25 MG PO CAPS: 25.0000 mg | ORAL_CAPSULE | Freq: Four times a day (QID) | ORAL | Status: DC | PRN

## 2023-11-27 MED ORDER — BUPIVACAINE HCL (PF) 0.25 % IJ SOLN
INTRAMUSCULAR | Status: DC | PRN
Start: 2023-11-27 — End: 2023-11-27
  Administered 2023-11-27: 10 mL via EPIDURAL

## 2023-11-27 MED ORDER — ACETAMINOPHEN 10 MG/ML IV SOLN
INTRAVENOUS | Status: DC | PRN
  Administered 2023-11-27: 1000 mg via INTRAVENOUS

## 2023-11-27 MED ORDER — LIDOCAINE-EPINEPHRINE (PF) 2 %-1:200000 IJ SOLN
INTRAMUSCULAR | Status: DC | PRN
  Administered 2023-11-27: 3 mL via EPIDURAL
  Administered 2023-11-27: 5 mL via EPIDURAL
  Administered 2023-11-27: 2 mL via EPIDURAL
  Administered 2023-11-27: 5 mL via EPIDURAL

## 2023-11-27 MED ORDER — MORPHINE SULFATE (PF) 0.5 MG/ML IJ SOLN
INTRAMUSCULAR | Status: DC | PRN
  Administered 2023-11-27: 3 mg via EPIDURAL

## 2023-11-27 MED ORDER — NALOXONE HCL 4 MG/10ML IJ SOLN: 1.0000 ug/kg/h | INTRAVENOUS | Status: DC | PRN

## 2023-11-27 MED ORDER — SENNOSIDES-DOCUSATE SODIUM 8.6-50 MG PO TABS
2.0000 | ORAL_TABLET | Freq: Every day | ORAL | Status: DC
Start: 2023-11-28 — End: 2023-11-27
  Filled 2023-11-27: qty 2

## 2023-11-27 MED ORDER — SODIUM CHLORIDE 0.9% IV SOLUTION
Freq: Once | INTRAVENOUS | Status: AC
  Administered 2023-11-27: 200 mL via INTRAVENOUS

## 2023-11-27 MED ORDER — DIBUCAINE (PERIANAL) 1 % EX OINT: 1.0000 | TOPICAL_OINTMENT | CUTANEOUS | Status: DC | PRN

## 2023-11-27 MED ORDER — SODIUM CHLORIDE 0.9 % IV SOLN
500.0000 mg | INTRAVENOUS | Status: AC
  Administered 2023-11-27: 500 mg via INTRAVENOUS

## 2023-11-27 MED ORDER — DEXAMETHASONE SODIUM PHOSPHATE 4 MG/ML IJ SOLN
INTRAMUSCULAR | Status: AC
Start: 2023-11-27 — End: 2023-11-27
  Filled 2023-11-27: qty 1

## 2023-11-27 MED ORDER — SODIUM CHLORIDE 0.9 % IV SOLN: 12.5000 mg | INTRAVENOUS | Status: DC | PRN

## 2023-11-27 MED ORDER — PRENATAL MULTIVITAMIN CH
1.0000 | ORAL_TABLET | Freq: Every day | ORAL | Status: DC
  Administered 2023-11-27 – 2023-11-29 (×3): 1 via ORAL
  Filled 2023-11-27 (×3): qty 1

## 2023-11-27 MED ORDER — LACTATED RINGERS IV SOLN
INTRAVENOUS | Status: DC
  Administered 2023-11-27: 950 mL via INTRAVENOUS

## 2023-11-27 MED ORDER — ACETAMINOPHEN 500 MG PO TABS
1000.0000 mg | ORAL_TABLET | Freq: Four times a day (QID) | ORAL | Status: DC
  Administered 2023-11-27 – 2023-11-29 (×9): 1000 mg via ORAL
  Filled 2023-11-27 (×9): qty 2

## 2023-11-27 MED ORDER — LACTATED RINGERS IV BOLUS
1000.0000 mL | Freq: Once | INTRAVENOUS | Status: AC
  Administered 2023-11-27: 1000 mL via INTRAVENOUS

## 2023-11-27 MED ORDER — SOD CITRATE-CITRIC ACID 500-334 MG/5ML PO SOLN: 30.0000 mL | ORAL | Status: DC

## 2023-11-27 MED ORDER — HYDROMORPHONE HCL 1 MG/ML IJ SOLN: 0.2500 mg | INTRAMUSCULAR | Status: DC | PRN

## 2023-11-27 MED ORDER — DIPHENHYDRAMINE HCL 50 MG/ML IJ SOLN: 12.5000 mg | INTRAMUSCULAR | Status: DC | PRN

## 2023-11-27 MED ORDER — ONDANSETRON HCL 4 MG/2ML IJ SOLN
INTRAMUSCULAR | Status: DC | PRN
  Administered 2023-11-27: 4 mg via INTRAVENOUS

## 2023-11-27 MED ORDER — MENTHOL 3 MG MT LOZG: 1.0000 | LOZENGE | OROMUCOSAL | Status: DC | PRN

## 2023-11-27 MED ORDER — ALBUMIN HUMAN 5 % IV SOLN: INTRAVENOUS | Status: DC | PRN

## 2023-11-27 MED ORDER — SIMETHICONE 80 MG PO CHEW: 80.0000 mg | CHEWABLE_TABLET | ORAL | Status: DC | PRN

## 2023-11-27 MED ORDER — MORPHINE SULFATE (PF) 0.5 MG/ML IJ SOLN
INTRAMUSCULAR | Status: AC
  Filled 2023-11-27: qty 10

## 2023-11-27 MED ORDER — SODIUM CHLORIDE 0.9% FLUSH: 3.0000 mL | INTRAVENOUS | Status: DC | PRN

## 2023-11-27 MED ORDER — FENTANYL CITRATE (PF) 100 MCG/2ML IJ SOLN
INTRAMUSCULAR | Status: DC | PRN
  Administered 2023-11-27: 100 ug via INTRAVENOUS

## 2023-11-27 MED ORDER — TRANEXAMIC ACID-NACL 1000-0.7 MG/100ML-% IV SOLN
1000.0000 mg | Freq: Once | INTRAVENOUS | Status: AC
  Administered 2023-11-27: 1000 mg via INTRAVENOUS

## 2023-11-27 MED ORDER — SODIUM CHLORIDE 0.9 % IV SOLN: INTRAVENOUS | Status: DC | PRN

## 2023-11-27 MED ORDER — AZITHROMYCIN 500 MG IV SOLR
INTRAVENOUS | Status: AC
  Filled 2023-11-27: qty 5

## 2023-11-27 MED ORDER — KETOROLAC TROMETHAMINE 30 MG/ML IJ SOLN: 15.0000 mg | Freq: Once | INTRAMUSCULAR | Status: DC

## 2023-11-27 MED ORDER — OXYTOCIN-SODIUM CHLORIDE 30-0.9 UT/500ML-% IV SOLN: 2.5000 [IU]/h | INTRAVENOUS | Status: DC

## 2023-11-27 MED ORDER — SIMETHICONE 80 MG PO CHEW
80.0000 mg | CHEWABLE_TABLET | Freq: Three times a day (TID) | ORAL | Status: DC
  Administered 2023-11-27 – 2023-11-29 (×7): 80 mg via ORAL
  Filled 2023-11-27 (×7): qty 1

## 2023-11-27 MED ORDER — DEXAMETHASONE SODIUM PHOSPHATE 4 MG/ML IJ SOLN
INTRAMUSCULAR | Status: DC | PRN
  Administered 2023-11-27: 4 mg via INTRAVENOUS

## 2023-11-27 MED ORDER — MEPERIDINE HCL 25 MG/ML IJ SOLN: 6.2500 mg | INTRAMUSCULAR | Status: DC | PRN

## 2023-11-27 MED ORDER — PHENYLEPHRINE HCL (PRESSORS) 10 MG/ML IV SOLN
INTRAVENOUS | Status: DC | PRN
Start: 2023-11-27 — End: 2023-11-27
  Administered 2023-11-27 (×2): 160 ug via INTRAVENOUS
  Administered 2023-11-27: 240 ug via INTRAVENOUS
  Administered 2023-11-27: 160 ug via INTRAVENOUS

## 2023-11-27 SURGICAL SUPPLY — 34 items
BENZOIN TINCTURE PRP APPL 2/3 (GAUZE/BANDAGES/DRESSINGS) IMPLANT
CHLORAPREP W/TINT 26 (MISCELLANEOUS) ×2 IMPLANT
CLAMP UMBILICAL CORD (MISCELLANEOUS) ×1 IMPLANT
CLOTH BEACON ORANGE TIMEOUT ST (SAFETY) ×1 IMPLANT
DRAPE C SECTION CLR SCREEN (DRAPES) ×1 IMPLANT
DRSG OPSITE POSTOP 4X10 (GAUZE/BANDAGES/DRESSINGS) ×1 IMPLANT
ELECTRODE REM PT RTRN 9FT ADLT (ELECTROSURGICAL) ×1 IMPLANT
EXTRACTOR VACUUM KIWI (MISCELLANEOUS) IMPLANT
GAUZE PAD ABD 7.5X8 STRL (GAUZE/BANDAGES/DRESSINGS) IMPLANT
GAUZE SPONGE 4X4 12PLY STRL LF (GAUZE/BANDAGES/DRESSINGS) IMPLANT
GLOVE BIO SURGEON STRL SZ 6.5 (GLOVE) ×1 IMPLANT
GLOVE BIOGEL PI IND STRL 7.0 (GLOVE) ×2 IMPLANT
GOWN STRL REUS W/TWL LRG LVL3 (GOWN DISPOSABLE) ×2 IMPLANT
KIT ABG SYR 3ML LUER SLIP (SYRINGE) IMPLANT
MAT PREVALON FULL STRYKER (MISCELLANEOUS) IMPLANT
NDL HYPO 25X5/8 SAFETYGLIDE (NEEDLE) IMPLANT
NEEDLE HYPO 25X5/8 SAFETYGLIDE (NEEDLE) IMPLANT
NS IRRIG 1000ML POUR BTL (IV SOLUTION) ×1 IMPLANT
PACK C SECTION WH (CUSTOM PROCEDURE TRAY) ×1 IMPLANT
PAD OB MATERNITY 4.3X12.25 (PERSONAL CARE ITEMS) ×1 IMPLANT
RETRACTOR WND ALEXIS 25 LRG (MISCELLANEOUS) ×1 IMPLANT
RTRCTR C-SECT PINK 25CM LRG (MISCELLANEOUS) IMPLANT
STRIP CLOSURE SKIN 1/2X4 (GAUZE/BANDAGES/DRESSINGS) IMPLANT
SUT CHROMIC 1 CTX 36 (SUTURE) ×2 IMPLANT
SUT PLAIN 0 NONE (SUTURE) IMPLANT
SUT VIC AB 0 CT1 27XBRD ANBCTR (SUTURE) ×2 IMPLANT
SUT VIC AB 2-0 CT1 TAPERPNT 27 (SUTURE) ×1 IMPLANT
SUT VIC AB 4-0 KS 27 (SUTURE) ×1 IMPLANT
SUT VICRYL+ 3-0 36IN CT-1 (SUTURE) IMPLANT
SUTURE PLAIN GUT 2.0 ETHICON (SUTURE) ×1 IMPLANT
TAPE CLOTH SURG 4X10 WHT LF (GAUZE/BANDAGES/DRESSINGS) IMPLANT
TOWEL OR 17X24 6PK STRL BLUE (TOWEL DISPOSABLE) ×1 IMPLANT
TRAY FOLEY W/BAG SLVR 14FR LF (SET/KITS/TRAYS/PACK) ×1 IMPLANT
WATER STERILE IRR 1000ML POUR (IV SOLUTION) ×1 IMPLANT

## 2023-11-27 NOTE — Progress Notes (Signed)
 Patient ID: Amanda Fischer, female   DOB: 07/17/1990, 33 y.o.   MRN: [id number]  Patient's status post 2 units packed red blood cells in PACU Posttransfusion CBC with hemoglobin of 9.0

## 2023-11-27 NOTE — Op Note (Signed)
 Operative Note    Preoperative Diagnosis IUP at 39 5/7wks  Arrest of dilation  Maternal intolerance of labor ( inadequate pain control)   Postoperative Diagnosis:Same 4. Macrosomic infant ( 9lbs 6oz)    Procedure: Primary low transverse cesarean section with slight extension into left lower quadrant   Surgeon: Delana, C DO  Assist: Hur, M DO  Anesthesia: Epidural   Fluids: LR 2L, 2u albumen  EBL: UOP: 50ml Meds: Ancef 4gm total, Zithromax, TXA, Pitocin  Transfusion - 2L prbcs  Findings: Viable female infant in asynclitic vertex position. APGARS 9,9. Weight 9lbs 6oz. Boggy uterus which firmed up with medications and suturing. Grossly normal tubes and ovaries    Specimen: Placenta to L/D   Procedure Note  Consent verified pre-op. All questions answered   Patient was taken to the operating room where her epidural anesthesia was retested, bolused and found to be adequate. She was prepped and draped in the normal sterile fashion and placed in the dorsal supine position with a leftward tilt. An appropriate time out was performed.   A Pfannenstiel skin incision was then made and carried through to the underlying layer of fascia by sharp dissection and Bovie cautery. The fascia was nicked in the midline and the incision was extended laterally with Mayo scissors. The superior and inferior aspects of the incision were grasped with kocher clamps and dissected off the underlying rectus muscles. The rectus muscles were separated in the midline  and the peritoneal cavity entered bluntly. The peritoneal incision was then extended both superiorly and inferiorly with careful attention to avoid both bowel and bladder.   The Alexis self-retaining wound retractor was then placed and the lower uterine segment exposed. The bladder flap was developed with Metzenbaum scissors and pushed away from the lower uterine segment. The lower uterine segment was then incised in a transverse fashion and  the cavity itself entered bluntly. The incision was extended bluntly. The infant's head was then lifted and delivered from the incision without difficulty. Vigorous spontaneous cry was noted during delivery.   The remainder of the infant delivered and the nose and mouth bulb suctioned. The cord was clamped and cut after a minute delay. The infant was handed off to the waiting nursery team. The placenta was then manually expressed from the uterus and the uterus cleared of all clots and debris with moist lap sponge.  The uterus was noted to be boggy and this was communicated to the anesthesia team. There was a slight extension of the uterine incision into the left lower quadrant. The angle was found and the extension repaired with great hemostasis noted.  The uterine incision was then repaired in 2 layers the first layer was a running locked layer 1-0 chromic and the second an imbricating layer of the same suture. The tubes and ovaries were inspected and the gutters cleared of all clots and debris. The uterine incision was inspected again and found to be hemostatic. All instruments and sponges were then removed from the abdomen.  CRNA reported patient unresponsive with no BP access. Anesthesiologist was called in to assist. Pt recovered with administration of medication s and blood products started - see anesthesia report.  The peritoneum was then reapproximated in a running fashion with sutures of 2-0 Vicryl The fascia was then closed with 0 Vicryl in a running fashion. Subcutaneous tissue was reapproximated with 3-0 plain in a running fashion. The skin was closed with a subcuticular stitch of 4-0 Vicryl on a Keith needle and then  reinforced with benzoin steri-strips and a honeycomb dressing.  At the conclusion of the procedure all instruments and sponge counts were correct. Patient was taken to the recovery room in good condition with her baby accompanying her skin to skin.  Intra-op Hemoglobin 8.5

## 2023-11-27 NOTE — Lactation Note (Addendum)
 This note was copied from a baby's chart. Lactation Consultation Note  Patient Name: Amanda Fischer Unijb'd Date: 11/27/2023 Age:32 hours Reason for consult: Initial assessment;Term  Father of baby interpreting [place]   P4, Mother has breastfed all her children for approximately one - two months until she went back to work. Mother does have an old breastpump at home but was agreeable for [facility] to send a referral for a stork pump.  Observed mother breastfeeding infant with intermittent swallows.  Mother holding tissue away from infant's nose.  Discussed the importance of a deep latch and how infant's breath when they breastfeed.  Feed on demand with cues.  Goal 8-12+ times per day after first 24 hrs.  Place baby STS if not cueing.  Encouraged mother to offer the breast before formula to help establish her milk supply.  Karleen Baba interpreter used. Returned to room - [employer]ra  Stork pump given.    Maternal Data Has patient been taught Hand Expression?: Yes Does the patient have breastfeeding experience prior to this delivery?: Yes How long did the patient breastfeed?: one month  Feeding Mother's Current Feeding Choice: Breast Milk and Formula  LATCH Score Latch: Grasps breast easily, tongue down, lips flanged, rhythmical sucking.  Audible Swallowing: A few with stimulation  Type of Nipple: Everted at rest and after stimulation  Comfort (Breast/Nipple): Soft / non-tender  Hold (Positioning): No assistance needed to correctly position infant at breast.  LATCH Score: 9   Interventions Interventions: Breast feeding basics reviewed;Education  Discharge Pump: Referral sent for [facility] Pump  Consult Status Consult Status: Follow-up Date: 11/28/23 Follow-up type: In-patient   Shannon Dines Boschen  RN, IBCLC 11/27/2023, 11:17 AM

## 2023-11-27 NOTE — Progress Notes (Signed)
   11/27/23 1538  Vital Signs  Temp 98.5 F (36.9 C)  Orthostatic Lying   BP- Lying (!) 68/31  Pulse- Lying 90  Orthostatic Sitting  BP- Sitting (!) 71/45  Pulse- Sitting 96  Orthostatic Standing at 0 minutes  BP- Standing at 0 minutes (!) 58/37  Pulse- Standing at 0 minutes 98   Called dr Okey regarding bp Pt diaphoretic and appears pale. MD ordered a unit of blood RN spoke with patient regarding the blood and pt stated she wants it with the ipad interpreter

## 2023-11-27 NOTE — Progress Notes (Signed)
 Chaplain responded to Neonatal Code Hemorrhage.  Event quickly controlled.  Mother is reported to be fine and not in need of Chaplain at this time.   [place] Chaplain

## 2023-11-27 NOTE — Progress Notes (Signed)
 Patient ID: Amanda Fischer, female   DOB: [birth year], 33 y.o.   MRN: [id number] Pt c/o 9/10 pelvic pain SVE - unchanged at 8cm dil and now slightly swollen on ant lip.  EFM - 145, mod variability, variable decels, no late decels, + accels. Cat 2 TOCO - ctxs q 2-4 mins   A/P: H5E6996 at 39 4/7wks s/p epidural. On of pitocin  with no cervical change over past 3 hrs - Anesthesiologist notified of inadequate pain relief; en route to reassess - IUPC placed and inadequate mvus noted. Titrate pitocin  till adequate  - Benadryl  25mg  IV x 1 now  - Pt in lateral recumbent position with ball

## 2023-11-27 NOTE — Transfer of Care (Signed)
 Immediate Anesthesia Transfer of Care Note  Patient: Amanda Fischer  Procedure(s) Performed: CESAREAN DELIVERY  Patient Location: PACU  Anesthesia Type:Epidural  Level of Consciousness: awake, alert , and oriented  Airway & Oxygen Therapy: Patient Spontanous Breathing  Post-op Assessment: Report given to RN and Post -op Vital signs reviewed and stable  Post vital signs: Reviewed and stable  Last Vitals:  Vitals Value Taken Time  BP 118/77 11/27/23 07:20  Temp 37.3 C 11/27/23 07:20  Pulse 103 11/27/23 07:30  Resp 20 11/27/23 07:30  SpO2 100 % 11/27/23 07:30  Vitals shown include unfiled device data.  Last Pain:  Vitals:   11/27/23 0720  TempSrc: Oral  PainSc:          Complications: No notable events documented.

## 2023-11-27 NOTE — Progress Notes (Signed)
 Patient ID: Amanda Fischer, female   DOB: June 18, 1990, 33 y.o.   MRN: [id number] Pt has had intermittent pain rated at 9/10 mostly in her lower back.   Anesthesia redosed her with temporary relief.  Pt sobbing due to pain.   Fetal heart rate noted with variable decelerations intermittently in certain positions, Amnioinfusion was started with some resolution   Unable to increase pitocin  past despite inadequate mvus due to both maternal discomfort and fetal heart rate  Cervix remains at 8cm with swelling worsening in anterior portion despite two doses of benadryl   Pt was given option of continued labor with another attempt at pain relief vs primary cesarean section.   After contemplating for she opted for delivery via cesarean section  R/B and expectations were explained  and all questions answered.  Pitocin  stopped and pt to OR when ready: staff and anesthesiologist aware

## 2023-11-28 ENCOUNTER — Encounter (HOSPITAL_COMMUNITY): Payer: Self-pay | Admitting: Obstetrics and Gynecology

## 2023-11-28 LAB — CBC
HCT: 25.8 % — ABNORMAL LOW (ref 36.0–46.0)
HCT: 33.7 % — ABNORMAL LOW (ref 36.0–46.0)
Hemoglobin: 8.7 g/dL — ABNORMAL LOW (ref 12.0–15.0)
Hemoglobin: 11.1 g/dL — ABNORMAL LOW (ref 12.0–15.0)
MCH: 24.4 pg — ABNORMAL LOW (ref 26.0–34.0)
MCH: 24.9 pg — ABNORMAL LOW (ref 26.0–34.0)
MCHC: 33.7 g/dL (ref 30.0–36.0)
MCHC: 33.2 g/dL (ref 30.0–36.0)
MCV: 72.3 fL — ABNORMAL LOW (ref 80.0–100.0)
MCV: 74.9 fL — ABNORMAL LOW (ref 80.0–100.0)
Platelets: 175 K/uL (ref 150–400)
Platelets: 214 K/uL (ref 150–400)
RBC: 3.57 MIL/uL — ABNORMAL LOW (ref 3.87–5.11)
RBC: 4.5 MIL/uL (ref 3.87–5.11)
RDW: 21 % — ABNORMAL HIGH (ref 11.5–15.5)
RDW: 21.3 % — ABNORMAL HIGH (ref 11.5–15.5)
WBC: 16.4 K/uL — ABNORMAL HIGH (ref 4.0–10.5)
WBC: 17.6 K/uL — ABNORMAL HIGH (ref 4.0–10.5)
nRBC: 0.1 % (ref 0.0–0.2)
nRBC: 0.2 % (ref 0.0–0.2)

## 2023-11-28 LAB — PREPARE RBC (CROSSMATCH)

## 2023-11-28 MED ORDER — SODIUM CHLORIDE 0.9% IV SOLUTION: Freq: Once | INTRAVENOUS | Status: DC

## 2023-11-28 NOTE — Progress Notes (Signed)
 Subjective: Postpartum Day 1: Cesarean Delivery  Patient reports pain is well controlled at rest and 3/10 pain with ambulation. She is ambulating with assistance, denies light-headedness or dizziness. Voiding clear urine via foley catheter. Minimal lochia. Tolerating regular diet w/o N/V.   Attempting to breast feed and supplementing w/ formula. Notes milk supply has not come in ye.   Objective: Vital signs in last 24 hours: Temp:  [98.2 F (36.8 C)-99.7 F (37.6 C)] 98.4 F (36.9 C) (10/09 0500) Pulse Rate:  [80-111] 81 (10/09 0500) Resp:  [16-25] 16 (10/09 0500) BP: (71-92)/(40-57) 84/45 (10/09 0500) SpO2:  [93 %-100 %] 100 % (10/09 0500)  Physical Exam:  General: alert, no distress, and pale Lochia: appropriate Cardiovascular: WWP, regular rate Lung: no increased work of breathing Abdomen: non-distended, mild TTP above left aspect on incision, no rebound or guarding  Uterine Fundus: firm, non-tender Incision: honeycomb dressing in place w/ minimal strike-through DVT Evaluation: No evidence of DVT seen on physical exam.  Recent Labs    11/27/23 2205 11/28/23 0449  HGB 9.5* 8.7*  HCT 28.2* 25.8*    Assessment/Plan: Status post Cesarean section. Postoperative course complicated by post-partum hemorrhage (EBL 1745). Doing well post-operatively.Continue current care.  #Acute blood loss anemia, clinically significant for this admission  - EBL 1745, Hgb 10.2 on admit, 7.9 POD0 - s/p 2U in PACU and 1U on floor - POD1 AM Hgb 8.7  -  BP still soft w/ MAP <65, adequate UOP and intake over last 24hr. - Plan to transfuse 1U PRBC   Charmaine CHRISTELLA Oz, MD 11/28/2023, 7:33 AM

## 2023-11-28 NOTE — Progress Notes (Signed)
 Pt 's husband/interpreter stated that mom felt dizzy while standing in the bathroom while brushing her teeth. RN asked pt to call out for assistance when ambulating

## 2023-11-28 NOTE — Progress Notes (Signed)
 Dr. Lane called for dayshift game plan. Pt reports feeling better, but still weak, slow-moving, and sweaty. 3 units all together of PRBCs and 2 units of Albumin infused for total EBL of 1745. Pt with very scant bleeding overnight.   Able to ambulate, though in pain 3/10, but without dizziness or lightheadedness.   BP still soft 84/45, but relatively unchanged over last day.   Foley catheter kept in overnight with excellent output, but not removed due to pt status. OB requests F/C to be removed today.   Report given to Lauraine, RN.   Jarica Plass, RN 11/28/23

## 2023-11-28 NOTE — Anesthesia Postprocedure Evaluation (Signed)
 Anesthesia Post Note  Patient: Amanda Fischer  Procedure(s) Performed: CESAREAN DELIVERY     Patient location during evaluation: PACU Anesthesia Type: Epidural Level of consciousness: awake and alert Pain management: pain level controlled Vital Signs Assessment: post-procedure vital signs reviewed and stable Respiratory status: spontaneous breathing, nonlabored ventilation and respiratory function stable Cardiovascular status: blood pressure returned to baseline and stable Postop Assessment: no apparent nausea or vomiting Anesthetic complications: no   No notable events documented.  Last Vitals:  Vitals:   11/27/23 1945 11/28/23 0500  BP: (!) 84/42 (!) 84/45  Pulse: 84 81  Resp: 16 16  Temp: 37 C 36.9 C  SpO2: 99% 100%    Last Pain:  Vitals:   11/28/23 0500  TempSrc: Oral  PainSc: 4                  Butler Levander Pinal

## 2023-11-28 NOTE — Progress Notes (Signed)
 Dad shared with me that mom : passes out all the time at home. Pt denies any tachycardia preceding syncope.   Drinks  a lot of water, so her large fluid intake is mostly normal, maybe just a little bit more here.  Pt c/o pain only when getting up. Pt's uterus was largely diverted to the right 1h after voiding . When she voided this 1h later, she put out and her uterus returned midline. RN asked pt to try to void every couple of hours since her output is so large and it puts more pressure on her incision/uterus

## 2023-11-29 LAB — BPAM RBC
Blood Product Expiration Date: 202511052359
Blood Product Expiration Date: 202511032359
Blood Product Expiration Date: 202511032359
Blood Product Expiration Date: 202511052359
ISSUE DATE / TIME: 202510090804
ISSUE DATE / TIME: 202510080645
ISSUE DATE / TIME: 202510080645
ISSUE DATE / TIME: 202510081757
Unit Type and Rh: 5100
Unit Type and Rh: 5100
Unit Type and Rh: 5100
Unit Type and Rh: 5100

## 2023-11-29 LAB — TYPE AND SCREEN
ABO/RH(D): O POS
Antibody Screen: NEGATIVE
Unit division: 0
Unit division: 0
Unit division: 0
Unit division: 0

## 2023-11-29 MED ORDER — POLYETHYLENE GLYCOL 3350 17 G PO PACK: 17.0000 g | PACK | Freq: Every day | ORAL | Status: DC

## 2023-11-29 MED ORDER — IBUPROFEN 800 MG PO TABS: 800.0000 mg | ORAL_TABLET | Freq: Three times a day (TID) | ORAL | 0 refills | Status: AC

## 2023-11-29 MED ORDER — POLYETHYLENE GLYCOL 3350 17 GM/SCOOP PO POWD: 17.0000 g | Freq: Every day | ORAL | 0 refills | Status: AC

## 2023-11-29 MED ORDER — OXYCODONE HCL 5 MG PO TABS: 5.0000 mg | ORAL_TABLET | Freq: Four times a day (QID) | ORAL | 0 refills | Status: AC | PRN

## 2023-11-29 MED ORDER — ACETAMINOPHEN 500 MG PO TABS: 1000.0000 mg | ORAL_TABLET | Freq: Three times a day (TID) | ORAL | 0 refills | Status: AC

## 2023-11-29 NOTE — Progress Notes (Signed)
 Subjective: Postpartum Day 2: Cesarean Delivery Patient reports feeling much better today. No longer dizzy. Pain uncontrolled. Tolerating PO and voiding without difficulty. +flatus/BM. Pain with ambulation  Objective: Vital signs in last 24 hours: Temp:  [97.7 F (36.5 C)-98.2 F (36.8 C)] 98.2 F (36.8 C) (10/10 0550) Pulse Rate:  [72-76] 72 (10/10 0550) Resp:  [16] 16 (10/10 0550) BP: (90-100)/(63-70) 90/66 (10/10 0550) SpO2:  [98 %] 98 % (10/09 2009)  Physical Exam:  General: fatigued and no distress Lochia: appropriate Uterine Fundus: firm Incision: Honeycomb in place, clean and dry DVT Evaluation: No cords or calf tenderness. No significant calf/ankle edema.  Recent Labs    11/28/23 0449 11/28/23 1807  HGB 8.7* 11.1*  HCT 25.8* 33.7*    Assessment/Plan: 33 yo G4P4 POD#2 s/p PCS for AoDi in setting of LGA fetus - PP: pt has not received any oxycodone  post-operatively. Will give a dose now as well as place abdominal binder - Postpartum hemorrhage with ABLA, clinically significant for this hospitalization. S/p 3u pRBCs, Hb last PM 11.1 - Rh positive - Dizziness: possibly related to blood loss, however husband reports patient has a history of chronic dizziness. Her symptoms have completely resolved and EKG shows NSR - Dispo: DC home later today vs. tomorrow pending pain control  Larraine DELENA Sharps, DO 11/29/2023, 10:53 AM

## 2023-11-29 NOTE — Progress Notes (Signed)
 Per patient via interpreter, she is feeling a lot better than yesterday.

## 2023-11-29 NOTE — Lactation Note (Signed)
 This note was copied from a baby's chart. Lactation Consultation Note  Patient Name: Amanda Fischer Date: 11/29/2023 Age:32 hours Reason for consult: Follow-up assessment;Term (DAT+, lost 1745 of blood post birth) LC tried to use translator, but FOB declined reporting that he would translate for MOB.   P4- MOB's feeding plan is to offer both breast milk and formula. MOB reports that she has seen an increase in milk volume today, but she does not feel like it is enough for infant. LC reviewed the size of infant's stomach and the volume she should be taking in at 58 hrs. MOB reports that she does not have any questions or concerns. LC reviewed engorgement/breast care. LC encouraged MOB to call for further assistance as needed.  Maternal Data Has patient been taught Hand Expression?: No Does the patient have breastfeeding experience prior to this delivery?: Yes  Feeding Mother's Current Feeding Choice: Breast Milk and Formula  Lactation Tools Discussed/Used Pump Education: Milk Storage  Interventions Interventions: Education;LC Services brochure  Discharge Discharge Education: Engorgement and breast care;Warning signs for feeding baby Pump: Received Stork Pump  Consult Status Consult Status: Complete Date: 11/29/23    Recardo Hoit BS, IBCLC 11/29/2023, 4:52 PM

## 2023-11-29 NOTE — Social Work (Signed)
 CSW received consult for hx of Postpartum Depression and an [place] Postnatal Depression Screen score of 11.  CSW met with MOB to offer support and complete assessment. CSW entered the room and observed MOB resting in bed holding the infant. CSW arranged [employer] language interpreter Raguel [phone]. CSW introduced self, CSW role and reason for visit. MOB was agreeable to visit. CSW inquired about ho MOB was feeling, MOB reported after a rough few days she is feeling better now. 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 MOB reported she would be willing to restart medication if she needs to but reported stable mood currently. CSW assessed for safety, MOB denied any SI, HI or DV. CSW provided education regarding the baby blues period vs. perinatal mood disorders, discussed treatment and gave resources for mental health follow up if concerns arise.  CSW recommends self-evaluation during the postpartum time period using the New Mom Checklist from Postpartum Progress and encouraged MOB to contact a medical professional if symptoms are noted at any time.  MOB identified FOB as her primary support.  CSW provided review of Sudden Infant Death Syndrome (SIDS) precautions. MOB reported she has a car seat but does not have a place for the infant to sleep, CSW informed MOB a Pack n Play could provided, MOB was agreeable. CSW provided a pack n play for MOB. MOB arranged an appointment with [facility] for support in the community for 10/23 @ 10am CSW identifies no further need for intervention and no barriers to discharge at this time.  Maruice Pieroni, LCSWA Clinical Social Worker [phone]

## 2023-11-29 NOTE — Discharge Summary (Signed)
 Postpartum Discharge Summary  Date of Service updated     Patient Name: Amanda Fischer DOB: 05-05-1990 MRN: [id number]  Date of admission: 11/26/2023 Delivery date:11/27/2023 Delivering provider: DELANA BASE [facility] Date of discharge: 11/29/2023  Admitting diagnosis: Pregnant [Z34.90] Intrauterine pregnancy: [redacted]w[redacted]d     Secondary diagnosis:  Principal Problem:   Pregnant  Additional problems: Postpartum hemorrhage    Discharge diagnosis: Term Pregnancy Delivered, Anemia, and PPH                                              Post partum procedures:blood transfusion Augmentation: AROM and Pitocin  Complications: Hemorrhage>1026mL  Hospital course: Induction of Labor With Cesarean Section   33 y.o. yo [id number] at [redacted]w[redacted]d was admitted to the hospital 11/26/2023 for induction of labor for PROM. Her labor course was complicated by arrest of dilation and maternal intolerance to labor (inadequate pain control) The patient went for cesarean section due to Macrosomia and Arrest of Dilation. Delivery details are as follows: Membrane Rupture Time/Date:  ,   Delivery Method:C-Section, Low Transverse Operative Delivery:N/A Details of operation can be found in separate operative note, but in short she had a postpartum hemorrhage and received 2u pRBC intraoperatively.  Patient had a postpartum course complicated by acute blood loss anemia requiring an additional unit of blood transfusion. She is ambulating, tolerating a regular diet, passing flatus, and urinating well.  Patient is discharged home in stable condition on 11/29/23.      Newborn Data: Birth date:11/27/2023 Birth time:6:20 AM Gender:Female Living status:Living Apgars:9 ,9  Weight:4250 g                               Magnesium  Sulfate received: No BMZ received: No Rhophylac:N/A MMR:N/A T-DaP:Given prenatally Flu: Yes given prenatally RSV Vaccine received: No Transfusion:Yes Immunizations administered: Immunization History   Administered Date(s) Administered   Influenza, Seasonal, Injecte, Preservative Fre 05/23/2023   Influenza,inj,Quad PF,6+ Mos 12/03/2013   Tdap 12/02/2013    Physical exam  Vitals:   11/28/23 1800 11/28/23 2009 11/29/23 0550 11/29/23 1509  BP: 96/63 100/70 90/66 (!) 93/56  Pulse: 76 75 72 81  Resp:  16 16 17   Temp: 98.2 F (36.8 C) 98 F (36.7 C) 98.2 F (36.8 C) 98.5 F (36.9 C)  TempSrc:  Oral Oral Oral  SpO2: 98% 98%  98%  Weight:      Height:       General: cooperative and no distress Lochia: appropriate Uterine Fundus: firm Incision: Honeycomb in place, small strikethrough DVT Evaluation: No cords or calf tenderness. No significant calf/ankle edema. Labs: Lab Results  Component Value Date   WBC 17.6 (H) 11/28/2023   HGB 11.1 (L) 11/28/2023   HCT 33.7 (L) 11/28/2023   MCV 74.9 (L) 11/28/2023   PLT 214 11/28/2023      Latest Ref Rng & Units 11/27/2023    6:41 AM  CMP  Sodium 135 - 145 mmol/L 136   Potassium 3.5 - 5.1 mmol/L 4.5    Edinburgh Score:    11/29/2023   11:29 AM  Edinburgh Postnatal Depression Scale Screening Tool  I have been able to laugh and see the funny side of things. 1  I have looked forward with enjoyment to things. 1  I have blamed myself unnecessarily when things went wrong. 1  I have been anxious or worried for no good reason. 2  I have felt scared or panicky for no good reason. 1  Things have been getting on top of me. 1  I have been so unhappy that I have had difficulty sleeping. 1  I have felt sad or miserable. 1  I have been so unhappy that I have been crying. 1  The thought of harming myself has occurred to me. 1  Edinburgh Postnatal Depression Scale Total 11      After visit meds:  Allergies as of 11/29/2023   No Known Allergies      Medication List     TAKE these medications    acetaminophen  500 MG tablet Commonly known as: TYLENOL  Take 2 tablets (1,000 mg total) by mouth 3 (three) times daily. What changed:   when to take this reasons to take this   ibuprofen  800 MG tablet Commonly known as: ADVIL  Take 1 tablet (800 mg total) by mouth 3 (three) times daily.   oxyCODONE  5 MG immediate release tablet Commonly known as: Oxy IR/ROXICODONE  Take 1 tablet (5 mg total) by mouth every 6 (six) hours as needed for moderate pain (pain score 4-6).   polyethylene glycol powder 17 GM/SCOOP powder Commonly known as: MiraLax  Take 17 g by mouth daily. Dissolve 1 capful (17g) in 4-8 ounces of liquid and take by mouth daily.               Discharge Care Instructions  (From admission, onward)           Start     Ordered   11/29/23 0000  Discharge wound care:       Comments: Remove bandage on date marked   11/29/23 1657             Discharge home in stable condition Infant Feeding: Bottle and Breast Infant Disposition:home with mother Discharge instruction: per After Visit Summary and Postpartum booklet. Activity: Advance as tolerated. Pelvic rest for 6 weeks.  Diet: routine diet Anticipated Birth Control: Nexplanon Postpartum Appointment:6 weeks Additional Postpartum F/U: Postpartum Depression checkup and Incision check 1-2 weeks Future Appointments:No future appointments. Follow up Visit:  Follow-up Information     Ob/Gyn, Landy Stains. Go in 2 week(s).   Why: Office will call you to schedule scar check-up Contact information: [address] [place] [address] [phone]                     11/29/2023 Larraine DELENA Sharps, DO

## 2023-11-29 NOTE — Discharge Instructions (Signed)
 Call office with any concerns [phone]

## 2023-12-03 DIAGNOSIS — O479 False labor, unspecified: Secondary | ICD-10-CM

## 2023-12-06 ENCOUNTER — Telehealth (HOSPITAL_COMMUNITY): Payer: Self-pay

## 2023-12-06 NOTE — Telephone Encounter (Signed)
 12/06/2023 1940  Name: Amanda Fischer MRN: [id number] DOB: 04/13/1990  Reason for Call:  Transition of Care Hospital Discharge Call  Contact Status: Patient Contact Status: Message Attempted to call both phone numbers listed for patient.  Language assistant needed: Interpreter Mode: Telephonic Interpreter Interpreter Name: [phone] Interpreter Phone Number - If applicable: [phone]        Follow-Up Questions:    [place] Postnatal Depression Scale:  In the Past 7 Days:    PHQ2-9 Depression Scale:     Discharge Follow-up:    Post-discharge interventions: NA  Signature  Rosaline Deretha PEAK

## 2023-12-09 ENCOUNTER — Inpatient Hospital Stay (HOSPITAL_COMMUNITY): Admission: RE | Admit: 2023-12-09 | Source: Home / Self Care

## 2023-12-09 ENCOUNTER — Encounter (HOSPITAL_COMMUNITY): Payer: Self-pay

## 2023-12-09 ENCOUNTER — Inpatient Hospital Stay (HOSPITAL_COMMUNITY)
